# Patient Record
Sex: Female | Born: 2000 | Race: Black or African American | Hispanic: No | Marital: Single | Smoking: Never smoker
Health system: Southern US, Community
[De-identification: ages and names within clinical notes are randomized; demographics above are authoritative.]

## PROBLEM LIST (undated history)

## (undated) DIAGNOSIS — O139 Gestational [pregnancy-induced] hypertension without significant proteinuria, unspecified trimester: Secondary | ICD-10-CM

## (undated) DIAGNOSIS — I370 Nonrheumatic pulmonary valve stenosis: Secondary | ICD-10-CM

## (undated) DIAGNOSIS — D573 Sickle-cell trait: Secondary | ICD-10-CM

## (undated) DIAGNOSIS — Z3490 Encounter for supervision of normal pregnancy, unspecified, unspecified trimester: Secondary | ICD-10-CM

## (undated) DIAGNOSIS — D649 Anemia, unspecified: Secondary | ICD-10-CM

## (undated) HISTORY — PX: ADENOIDECTOMY: SUR15

## (undated) HISTORY — DX: Encounter for supervision of normal pregnancy, unspecified, unspecified trimester: Z34.90

## (undated) HISTORY — DX: Sickle-cell trait: D57.3

## (undated) HISTORY — DX: Anemia, unspecified: D64.9

## (undated) HISTORY — DX: Nonrheumatic pulmonary valve stenosis: I37.0

## (undated) HISTORY — PX: TONSILLECTOMY AND ADENOIDECTOMY: SUR1326

---
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
## 2020-01-12 ENCOUNTER — Encounter: Payer: Self-pay | Admitting: Student

## 2020-01-12 ENCOUNTER — Other Ambulatory Visit: Payer: Self-pay

## 2020-01-12 ENCOUNTER — Ambulatory Visit (INDEPENDENT_AMBULATORY_CARE_PROVIDER_SITE_OTHER): Payer: Medicaid Other | Admitting: Student

## 2020-01-12 VITALS — BP 121/69 | HR 96 | Ht 63.0 in | Wt 209.3 lb

## 2020-01-12 DIAGNOSIS — Q256 Stenosis of pulmonary artery: Secondary | ICD-10-CM

## 2020-01-12 DIAGNOSIS — Z8759 Personal history of other complications of pregnancy, childbirth and the puerperium: Secondary | ICD-10-CM

## 2020-01-12 NOTE — Patient Instructions (Addendum)
-  call [facility] for appt in Feb

## 2020-01-12 NOTE — Progress Notes (Signed)
Pt states is not interested in talking about Birth Control Today.

## 2020-01-12 NOTE — Progress Notes (Signed)
Post Partum Visit Note  Anita Hayes is a 19 y.o. G30P1001 female who presents for a postpartum visit. She is 6 weeks postpartum following a normal spontaneous vaginal delivery.  I have fully reviewed the prenatal and intrapartum course. The delivery was at 39.6 gestational weeks.  Anesthesia: epidural. Postpartum course has been without complications. Baby is doing well. Baby is feeding by bottle - Carn[employer] Good Start. Bleeding no bleeding. Bowel function is normal. Bladder function is normal. Patient is not sexually active. Contraception method is abstinence. Postpartum depression screening: negative.   The pregnancy intention screening data noted above was reviewed. Potential methods of contraception were discussed. The patient elected to proceed with Abstinence.    Edinburgh Postnatal Depression Scale - 01/12/20 1012      Edinburgh Postnatal Depression Scale:  In the Past 7 Days   I have been able to laugh and see the funny side of things. 0    I have looked forward with enjoyment to things. 0    I have blamed myself unnecessarily when things went wrong. 0    I have been anxious or worried for no good reason. 0    I have felt scared or panicky for no good reason. 0    Things have been getting on top of me. 2    I have been so unhappy that I have had difficulty sleeping. 0    I have felt sad or miserable. 0    I have been so unhappy that I have been crying. 0    The thought of harming myself has occurred to me. 0    Edinburgh Postnatal Depression Scale Total 2            The following portions of the patient's history were reviewed and updated as appropriate: allergies, current medications, past family history, past medical history, past social history, past surgical history and problem list.  Review of Systems Pertinent items are noted in HPI. Pertinent items noted in HPI and remainder of comprehensive ROS otherwise negative.    Objective:  BP 121/69   Pulse 96   Ht 5'  3" (1.6 m)   Wt 94.9 kg   LMP 02/06/2019 (Approximate)   Breastfeeding No   BMI 37.08 kg/m    General:  alert, cooperative, appears stated age and no distress   Breasts:  n/a  Lungs: clear to auscultation bilaterally  Heart:  regular rate and rhythm, S1, S2 normal, no murmur, click, rub or gallop, normal apical impulse and regular rate and rhythm  Abdomen: soft, non-tender; bowel sounds normal; no masses,  no organomegaly   Vulva:  not evaluated  Vagina: not evaluated  Cervix:  not evaluated  Corpus: not examined  Adnexa:  not evaluated  Rectal Exam: Not performed.        Assessment:    6 week postpartum exam. Pap smear not performed at today's visit.   1. Stenosis of left pulmonary artery --Patient to call to schedule follow up visit for March with [facility]  2. Postpartum care and examination --Patient educated on safe sex and contraception. Patient denies contraception at this time and states she will practice abstinence for now.   Plan:   Essential components of care per ACOG recommendations:  1.  Mood and well being: Patient with negative depression screening today. Reviewed local resources for support.  - Patient does not use tobacco. - hx of drug use? No     2. Infant care and feeding:  -  Patient currently breastmilk feeding? No  -Social determinants of health (SDOH) reviewed in EPIC. No concerns.The following needs were identified: none  3. Sexuality, contraception and birth spacing - Patient does not want a pregnancy in the next year.  Desired family size is 1 children.  - Reviewed forms of contraception in tiered fashion. Patient desired no method, abstinence today.   - Discussed birth spacing of 18 months  4. Sleep and fatigue -Encouraged family/partner/community support of 4 hrs of uninterrupted sleep to help with mood and fatigue  5. Physical Recovery  - Discussed patients delivery and complications - Patient had a 1st degree laceration,  perineal healing reviewed. Patient expressed understanding - Patient has urinary incontinence? No Patient was referred to pelvic floor PT  - Patient is safe to resume physical and sexual activity  6.  Health Maintenance -Patient not of age to have pap performed.   7. Chronic Disease - Follow up with [place] [facility] for appt in March  Sande Rives, [facility] for [employer], [employer] [facility]   Attestation of Supervision of Student:  I confirm that I have verified the information documented in the nurse midwife student's note and that I have also personally reperformed the history, physical exam and all medical decision making activities.  I have verified that all services and findings are accurately documented in this student's note; and I agree with management and plan as outlined in the documentation. I have also made any necessary editorial changes.    Marylene Land, CNM Center for [employer], [facility] 01/12/2020 11:57 AM

## 2020-01-12 NOTE — Progress Notes (Signed)
Post Partum Visit Note  Anita Hayes is a 19 y.o. G59P1001 female who presents for a postpartum visit. She is 6 weeks postpartum following a normal spontaneous vaginal delivery.  I have fully reviewed the prenatal and intrapartum course. The delivery was at 39/6 gestational weeks.  Anesthesia: epidural. Postpartum course has been uneventful. Baby is doing well. Baby is feeding by bottle - [employer] Good Start. Bleeding no bleeding. Bowel function is normal. Bladder function is normal. Patient is not sexually active. Contraception method is none. Postpartum depression screening: negative.   The pregnancy intention screening data noted above was reviewed. Potential methods of contraception were discussed. The patient elected to proceed with No Method - Other Reason.    Edinburgh Postnatal Depression Scale - 01/12/20 1012      Edinburgh Postnatal Depression Scale:  In the Past 7 Days   I have been able to laugh and see the funny side of things. 0    I have looked forward with enjoyment to things. 0    I have blamed myself unnecessarily when things went wrong. 0    I have been anxious or worried for no good reason. 0    I have felt scared or panicky for no good reason. 0    Things have been getting on top of me. 2    I have been so unhappy that I have had difficulty sleeping. 0    I have felt sad or miserable. 0    I have been so unhappy that I have been crying. 0    The thought of harming myself has occurred to me. 0    Edinburgh Postnatal Depression Scale Total 2            The following portions of the patient's history were reviewed and updated as appropriate: allergies, current medications, past family history, past medical history, past social history, past surgical history and problem list.  Review of Systems Pertinent items are noted in HPI.    Objective:  BP 121/69   Pulse 96   Ht 5\' 3"  (1.6 m)   Wt 209 lb 4.8 oz (94.9 kg)   LMP 02/06/2019 (Approximate)    Breastfeeding No   BMI 37.08 kg/m    General:  alert and cooperative   Breasts:  negative  Lungs: clear to auscultation bilaterally  Heart:  regular rate and rhythm, S1, S2 normal, no murmur, click, rub or gallop  Abdomen: soft, non-tender; bowel sounds normal; no masses,  no organomegaly   Vulva:  not evaluated  Vagina: not evaluated  Cervix:  not examined.   Corpus: not examined  Adnexa:  not evaluated  Rectal Exam: Not performed.        Assessment:    Healthy postpartum exam. Pap smear not done at today's visit.   Plan:   Essential components of care per ACOG recommendations:  1.  Mood and well being: Patient with negative depression screening today. Reviewed local resources for support.  - Patient does not use tobacco.  - hx of drug use? No   2. Infant care and feeding:  -Patient currently breastmilk feeding? No  -Social determinants of health (SDOH) reviewed in EPIC. No concerns  3. Sexuality, contraception and birth spacing - Patient does not want a pregnancy in the next year.  Desired family size is unknown children.  - Reviewed forms of contraception in tiered fashion. Patient desired not to talk about birth control today. States "I'm not doing that anymore".  today.   -  Discussed birth spacing of 18 months  4. Sleep and fatigue -Encouraged family/partner/community support of 4 hrs of uninterrupted sleep to help with mood and fatigue  5. Physical Recovery  - Discussed patients delivery and complications - Patient had a 1st degree laceration, perineal healing reviewed. Patient expressed understanding - Patient has urinary incontinence? No  - Patient is safe to resume physical and sexual activity  6.  Health Maintenance  7. Chronic Disease - PCP follow up -Will follow up with  [facility] for cardiac care in Feb. Patient has number and is planning to call.   Marylene Land, CNM Center for [employer], [facility]

## 2020-02-15 DIAGNOSIS — Z03818 Encounter for observation for suspected exposure to other biological agents ruled out: Secondary | ICD-10-CM | POA: Diagnosis not present

## 2020-03-17 ENCOUNTER — Encounter: Payer: Self-pay | Admitting: Family Medicine

## 2020-04-12 ENCOUNTER — Encounter: Payer: Self-pay | Admitting: Internal Medicine

## 2020-04-12 ENCOUNTER — Other Ambulatory Visit: Payer: Self-pay

## 2020-04-12 ENCOUNTER — Ambulatory Visit (INDEPENDENT_AMBULATORY_CARE_PROVIDER_SITE_OTHER): Payer: Medicaid Other | Admitting: Internal Medicine

## 2020-04-12 DIAGNOSIS — O99210 Obesity complicating pregnancy, unspecified trimester: Secondary | ICD-10-CM | POA: Insufficient documentation

## 2020-04-12 DIAGNOSIS — Q256 Stenosis of pulmonary artery: Secondary | ICD-10-CM

## 2020-04-12 HISTORY — DX: Morbid (severe) obesity due to excess calories: E66.01

## 2020-04-12 NOTE — Patient Instructions (Signed)
Medication Instructions:  Your physician recommends that you continue on your current medications as directed. Please refer to the Current Medication list given to you today.  *If you need a refill on your cardiac medications before your next appointment, please call your pharmacy*   Lab Work: NONE If you have labs (blood work) drawn today and your tests are completely normal, you will receive your results only by: Marland Kitchen MyChart Message (if you have MyChart) OR . A paper copy in the mail If you have any lab test that is abnormal or we need to change your treatment, we will call you to review the results.   Testing/Procedures: Your physician has requested that you have an echocardiogram in 23 months. Echocardiography is a painless test that uses sound waves to create images of your heart. It provides your doctor with information about the size and shape of your heart and how well your heart's chambers and valves are working. This procedure takes approximately one hour. There are no restrictions for this procedure.     Follow-Up: At [facility], you and your health needs are our priority.  As part of our continuing mission to provide you with exceptional heart care, we have created designated Provider Care Teams.  These Care Teams include your primary Cardiologist (physician) and Advanced Practice Providers (APPs -  Physician Assistants and Nurse Practitioners) who all work together to provide you with the care you need, when you need it.  We recommend signing up for the patient portal called "MyChart".  Sign up information is provided on this After Visit Summary.  MyChart is used to connect with patients for Virtual Visits (Telemedicine).  Patients are able to view lab/test results, encounter notes, upcoming appointments, etc.  Non-urgent messages can be sent to your provider as well.   To learn more about what you can do with MyChart, go to [email or web address].    Your next appointment:    24 month(s)  The format for your next appointment:   In Person  Provider:   You may see Izora Ribas, MD or one of the following Advanced Practice Providers on your designated Care Team:    Ronie Spies, PA-C  Jacolyn Reedy, PA-C

## 2020-04-12 NOTE — Progress Notes (Signed)
Cardiology Office Note:    Date:  04/12/2020   ID:  Anita Hayes, DOB 04-Apr-2000, MRN [id number]  Referring MD: Matilde Haymaker, MD   FM:[id number] left pulmonary arterial stenosis; Follow up  History of Present Illness:    Anita Hayes is a 20 y.o. 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 In interim of this visit, patient has no HD significant increase in gradient.  Had uncomplicated delivery.  Patient notes that she is doing well.  Since last visit notes no issues with delivery.   No chest pain or pressure.  No SOB/DOE.  No leg swelling.  No palpitations or syncope.  Past Medical History:  Diagnosis Date  . Acne 07/16/2016  . Pregnancy   . Pulmonary stenosis   . Sickle cell trait ([place])   . 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        Past Surgical History:  Procedure Laterality Date  . ADENOIDECTOMY    . TONSILLECTOMY AND ADENOIDECTOMY      Current Medications: Current Meds  Medication Sig  . acetaminophen (TYLENOL) 325 MG tablet Take 2 tablets (650 mg total) by mouth every 4 (four) hours as needed (for pain scale < 4).  Marland Kitchen amLODipine (NORVASC) 5 MG tablet Take 1 tablet (5 mg total) by mouth daily.  . Blood Pressure Monitoring (BLOOD PRESSURE KIT) DEVI 1 Device by Does not apply route as needed.  . ferrous sulfate 325 (65 FE) MG EC tablet Take 1 tablet (325 mg total) by mouth 3 (three) times daily with meals.  Marland Kitchen ibuprofen (ADVIL) 600 MG tablet Take 1 tablet (600 mg total) by mouth every 6 (six) hours.    Allergies:   Patient has no known allergies.   Social History   Socioeconomic History  . Marital status: Single    Spouse name: Not on file  . Number of children: Not on file  . Years of education: Not on  file  . Highest education level: Not on file  Occupational History  . Not on file  Tobacco Use  . Smoking status: Never Smoker  . Smokeless tobacco: Never Used  Substance and Sexual Activity  . Alcohol use: Never  . Drug use: Never  . Sexual activity: Not on file  Other Topics Concern  . Not on file  Social History Narrative   ** Merged History Encounter **       Social Determinants of Health   Financial Resource Strain: Not on file  Food Insecurity: No Food Insecurity  . Worried About Charity fundraiser in the Last Year: Never true  . Ran Out of Food in the Last Year: Never true  Transportation Needs: No Transportation Needs  . Lack of Transportation (Medical): No  . Lack of Transportation (Non-Medical): No  Physical Activity: Not on file  Stress: Not on file  Social Connections: Not on file    Social: Baby's Name is Phillips Odor  Family History: The patient's family history is notable for no congenital heart disease.    ROS:   Please see the history of present illness.    All other systems reviewed and are negative.  EKGs/Labs/Other Studies Reviewed:    The following studies were reviewed today:  EKG:   10/31/19: sinus rhythm, rate 99, without ST T chagnes.  Transthoracic Echocardiogram: Date: 11/16/19 Results: 1. Left ventricular ejection fraction, by estimation, is 60 to 65%. The  left ventricle has normal function. The left ventricle has no regional  wall motion abnormalities. Left ventricular diastolic parameters were  normal.  2. Right ventricular systolic function is normal. The right ventricular  size is normal.  3. The mitral valve is normal in structure. No evidence of mitral valve  regurgitation. No evidence of mitral stenosis.  4. The aortic valve is normal in structure. Aortic valve regurgitation is  not visualized.  5. The pulmonic valve, pulmonary truck and the proximal right and left  pulmonary arteries appear normal . I do not see any  evidence of pulmonary  artery stenosis.   Pediatric Echocardiogram: [id number] Results: 2016 Peds Echo Report INTERPRETATION SUMMARY  Trivial left pulmonary artery stenosis, peak gradient = 16 mmHg.  CARDIAC POSITION  Levocardia. Abdominal situs solitus. Atrial situs solitus. D Ventricular Loop. S Normal  position great vessels.  VEINS  Normal systemic venous connections. The right and left lower pulmonary veins are  confirmed draining normally to the left atrium. Normal pulmonary vein velocity.  ATRIA  Normal right atrial size. Normal left atrial size. The atrial septum is not well seen.  ATRIOVENTRICULAR VALVES  Normal tricuspid valve. Normal tricuspid valve inflow velocity. Trace tricuspid valve  regurgitation. Inadequate amount of tricuspid valve insufficiency to estimate right  ventricular pressures. Normal mitral valve. Normal mitral valve inflow velocity. No  mitral valve regurgitation.  VENTRICLES  Normal right ventricle structure and size. Normal left ventricle structure and size.  Intact ventricular septum.  CARDIAC FUNCTION  Normal right ventricular systolic function. Normal left ventricular systolic function.  SEMILUNAR VALVES  Normal pulmonic valve. Normal pulmonic valve velocity. Trivial pulmonary valve  insufficiency. Tricommissural aortic valve. Aortic valve mobility appears normal. Normal  aortic valve velocity by Doppler. No aortic valve insufficiency by color Doppler.  CORONARY ARTERIES  Normal origin and proximal course of the right coronary artery with prograde flow  demonstrated by color Doppler. Normal origin and proximal course of the left coronary  artery with prograde flow demonstrated by color Doppler.  GREAT ARTERIES  Left aortic arch with normal branching pattern. No evidence of coarctation of the aorta.  No aortic root dilation. There is normal pulsatility of the abdominal aorta. Normal main  pulmonary  artery size. Trivial left pulmonary artery stenosis. The peak instantaneous  pressure gradient in the left pulmonary artery is 16.  SHUNTS  No patent ductus arteriosus.  EXTRACARDIAC  No pericardial effusion. There is no pleural effusion.  2016 Peds Echo Report: +-----------------------------------------------------------------------------------------+  INTERPRETATION SUMMARY  Trivial left pulmonary artery stenosis, peak gradient = 16 mmHg.    CARDIAC POSITION  Levocardia. Abdominal situs solitus. Atrial situs solitus. D Ventricular Loop. S Normal  position great vessels.    VEINS  Normal systemic venous connections. The right and left lower pulmonary veins are  confirmed draining normally to the left atrium. Normal pulmonary vein velocity.    ATRIA  Normal right atrial size. Normal left atrial size. The atrial septum is not well seen.    ATRIOVENTRICULAR VALVES  Normal tricuspid valve. Normal tricuspid valve inflow velocity. Trace tricuspid valve  regurgitation. Inadequate amount of tricuspid valve insufficiency to estimate right  ventricular pressures. Normal mitral valve. Normal mitral valve inflow velocity. No  mitral valve regurgitation.    VENTRICLES  Normal right ventricle structure and size. Normal left ventricle structure and size.  Intact ventricular septum.    CARDIAC FUNCTION  Normal right ventricular systolic function. Normal left ventricular systolic function.    SEMILUNAR VALVES  Normal pulmonic valve. Normal pulmonic valve velocity. Trivial pulmonary valve  insufficiency. Tricommissural aortic valve. Aortic valve mobility appears normal. Normal  aortic valve velocity by Doppler. No aortic valve insufficiency by color Doppler.    CORONARY ARTERIES  Normal origin and proximal course of the right coronary artery with prograde flow  demonstrated by color Doppler. Normal origin and  proximal course of the left coronary  artery with prograde flow demonstrated by color Doppler.    GREAT ARTERIES  Left aortic arch with normal branching pattern. No evidence of coarctation of the aorta.  No aortic root dilation. There is normal pulsatility of the abdominal aorta. Normal main  pulmonary artery size. Trivial left pulmonary artery stenosis. The peak instantaneous  pressure gradient in the left pulmonary artery is 16.    SHUNTS  No patent ductus arteriosus.    EXTRACARDIAC  No pericardial effusion. There is no pleural effusion.     Physical Exam:    VS:  BP 122/68   Pulse 71   Ht 5' 3" (1.6 m)   Wt 224 lb (101.6 kg)   SpO2 98%   BMI 39.68 kg/m     Wt Readings from Last 3 Encounters:  04/12/20 224 lb (101.6 kg)  01/12/20 209 lb 4.8 oz (94.9 kg) (98 %, Z= 2.07)*  12/08/19 209 lb 3.2 oz (94.9 kg) (98 %, Z= 2.07)*   * Growth percentiles are based on CDC (Girls, 2-20 Years) data.    GEN: Well nourished, well developed in no acute distress HEENT: Normal NECK: No JVD; No carotid bruits LYMPHATICS: No lymphadenopathy CARDIAC: III/Vi systolic crescendo without thrill no rubs, gallops RESPIRATORY:  Clear to auscultation without rales, wheezing or rhonchi  ABDOMEN: Soft, non-tender, non-distended MUSCULOSKELETAL:  Trivial non-pitting edema; No deformity  SKIN: Warm and dry NEUROLOGIC:  Alert and oriented x 3 PSYCHIATRIC:  Normal affect   ASSESSMENT:    1. Morbid obesity ([place])   2. Pulmonary artery stenosis    PLAN:    In order of problems listed above:  Isolated Pulmonary Arterial Stenosis Morbid Obesity - left, mild and  asymptomatic - recommendation is for conservative follow up:  Will plan for 3 year echo and will see in 2 years; discussed red flag symptoms with patient and family - [facility] referral offered; patient deferred at this time, but will consider [facility] referral if symptomatic  Two year follow up unless new  symptoms or abnormal test results warranting change in plan  Would be reasonable for  Video Visit Follow up   Medication Adjustments/Labs and Tests Ordered: Current medicines are reviewed at length with the patient today.  Concerns regarding medicines are outlined above.  No orders of the defined types were placed in this encounter.  No orders of the defined types were placed in this encounter.   Patient Instructions  Medication Instructions:  Your physician recommends that you continue on your current medications as directed. Please refer to the Current Medication list given to you today.  *If you need a refill on your cardiac medications before your next appointment, please call your pharmacy*   Lab Work: NONE If you have labs (blood work) drawn today and your tests are completely normal, you will receive your results only by: Marland Kitchen MyChart Message (if you have MyChart) OR . A paper copy in the mail If you have any lab test that is abnormal or we need to  change your treatment, we will call you to review the results.   Testing/Procedures: Your physician has requested that you have an echocardiogram in 23 months. Echocardiography is a painless test that uses sound waves to create images of your heart. It provides your doctor with information about the size and shape of your heart and how well your heart's chambers and valves are working. This procedure takes approximately one hour. There are no restrictions for this procedure.     Follow-Up: At [facility], you and your health needs are our priority.  As part of our continuing mission to provide you with exceptional heart care, we have created designated Provider Care Teams.  These Care Teams include your primary Cardiologist (physician) and Advanced Practice Providers (APPs -  Physician Assistants and Nurse Practitioners) who all work together to provide you with the care you need, when you need it.  We recommend signing up for  the patient portal called "MyChart".  Sign up information is provided on this After Visit Summary.  MyChart is used to connect with patients for Virtual Visits (Telemedicine).  Patients are able to view lab/test results, encounter notes, upcoming appointments, etc.  Non-urgent messages can be sent to your provider as well.   To learn more about what you can do with MyChart, go to [email or web address].    Your next appointment:   24 month(s)  The format for your next appointment:   In Person  Provider:   You may see Gasper Sells, MD or one of the following Advanced Practice Providers on your designated Care Team:    Melina Copa, PA-C  Ermalinda Barrios, PA-C          Signed, Werner Lean, MD  04/12/2020 9:48 AM    [place]

## 2020-08-30 ENCOUNTER — Encounter: Payer: Self-pay | Admitting: Family Medicine

## 2020-09-03 NOTE — Telephone Encounter (Signed)
Scheduled patient for clinic follow up on 8/19. Patient attached pictures, one looking negative and one faintly positive. LMP 08/23/20.   FYI  Veronda Prude, RN

## 2020-09-21 ENCOUNTER — Ambulatory Visit: Payer: Medicaid Other | Admitting: Family Medicine

## 2020-11-01 ENCOUNTER — Encounter: Payer: Self-pay | Admitting: Family Medicine

## 2020-11-08 NOTE — Progress Notes (Signed)
    SUBJECTIVE:   CHIEF COMPLAINT / HPI: Pregnancy test  Missed last menstrual period and wants pregnancy test. LMP started 8/11 and ended 8/16.  She is not taking any regular medications.  She is unsure if she wants to keep the pregnancy and was asking about Plan B.  PERTINENT  PMH / PSH: Morbid obesity, sickle cell trait, pulmonary artery stenosis  OBJECTIVE:   BP 121/70   Pulse 85   Ht 5\' 3"  (1.6 m)   Wt 231 lb (104.8 kg)   SpO2 100%   BMI 40.92 kg/m   General: Alert, NAD CV: Regular rate Pulm: No respiratory distress  ASSESSMENT/PLAN:   Positive pregnancy test Positive urine pregnancy test in office. Approximately [redacted]w[redacted]d dated by LMP.  Prenatal labs obtained today (obstetric panel, OB urine culture, UA, HCV).  Resources given in case she wants to have an abortion, she will discuss with her partner.  Message sent to admin to schedule initial prenatal visit.     [redacted]w[redacted]d, MD [facility]

## 2020-11-08 NOTE — Patient Instructions (Addendum)
It was nice seeing you today!  Our scheduler will reach out to you regarding your first prenatal visit.  If there is anything abnormal on your blood work, I will give you a call.  Go ahead and start taking a prenatal vitamin.  If interested in abortion, there are two places in [place] you can go:  [facility] [address] [place], [place] [address] [phone]  [facility] [address], [place], [place] [address] [phone]  Please arrive at least 15 minutes prior to your scheduled appointments.  Stay well, Littie Deeds, MD [facility] [phone]

## 2020-11-09 ENCOUNTER — Ambulatory Visit (INDEPENDENT_AMBULATORY_CARE_PROVIDER_SITE_OTHER): Payer: Medicaid Other | Admitting: Family Medicine

## 2020-11-09 ENCOUNTER — Other Ambulatory Visit: Payer: Self-pay

## 2020-11-09 VITALS — BP 121/70 | HR 85 | Ht 63.0 in | Wt 231.0 lb

## 2020-11-09 DIAGNOSIS — N926 Irregular menstruation, unspecified: Secondary | ICD-10-CM

## 2020-11-09 DIAGNOSIS — Z3201 Encounter for pregnancy test, result positive: Secondary | ICD-10-CM | POA: Diagnosis not present

## 2020-11-09 LAB — POCT URINALYSIS DIP (CLINITEK)
Bilirubin, UA: NEGATIVE
Blood, UA: NEGATIVE
Glucose, UA: NEGATIVE mg/dL
Ketones, POC UA: NEGATIVE mg/dL
Nitrite, UA: NEGATIVE
POC PROTEIN,UA: NEGATIVE
Spec Grav, UA: 1.02 (ref 1.010–1.025)
Urobilinogen, UA: 0.2 E.U./dL
pH, UA: 6 (ref 5.0–8.0)

## 2020-11-09 LAB — POCT URINE PREGNANCY: Preg Test, Ur: POSITIVE — AB

## 2020-11-09 LAB — POCT UA - MICROSCOPIC ONLY: Epithelial cells, urine per micros: >20

## 2020-11-09 NOTE — Assessment & Plan Note (Signed)
Positive urine pregnancy test in office. Approximately [redacted]w[redacted]d dated by LMP.  Prenatal labs obtained today (obstetric panel, OB urine culture, UA, HCV).  Resources given in case she wants to have an abortion, she will discuss with her partner.  Message sent to admin to schedule initial prenatal visit.

## 2020-11-10 ENCOUNTER — Encounter: Payer: Self-pay | Admitting: Family Medicine

## 2020-11-10 LAB — OBSTETRIC PANEL, INCLUDING HIV
Antibody Screen: NEGATIVE
Basophils Absolute: 0 10*3/uL (ref 0.0–0.2)
Basos: 0 %
EOS (ABSOLUTE): 0.1 10*3/uL (ref 0.0–0.4)
Eos: 1 %
HIV Screen 4th Generation wRfx: NONREACTIVE
Hematocrit: 33.5 % — ABNORMAL LOW (ref 34.0–46.6)
Hemoglobin: 9.9 g/dL — ABNORMAL LOW (ref 11.1–15.9)
Hepatitis B Surface Ag: NEGATIVE
Immature Grans (Abs): 0 10*3/uL (ref 0.0–0.1)
Immature Granulocytes: 0 %
Lymphocytes Absolute: 2.4 10*3/uL (ref 0.7–3.1)
Lymphs: 31 %
MCH: 19 pg — ABNORMAL LOW (ref 26.6–33.0)
MCHC: 29.6 g/dL — ABNORMAL LOW (ref 31.5–35.7)
MCV: 64 fL — ABNORMAL LOW (ref 79–97)
Monocytes Absolute: 0.4 10*3/uL (ref 0.1–0.9)
Monocytes: 6 %
Neutrophils Absolute: 4.8 10*3/uL (ref 1.4–7.0)
Neutrophils: 62 %
Platelets: 243 10*3/uL (ref 150–450)
RBC: 5.22 x10E6/uL (ref 3.77–5.28)
RDW: 22.3 % — ABNORMAL HIGH (ref 11.7–15.4)
RPR Ser Ql: NONREACTIVE
Rh Factor: POSITIVE
Rubella Antibodies, IGG: <0.9 index — ABNORMAL LOW (ref >0.99)
WBC: 7.7 10*3/uL (ref 3.4–10.8)

## 2020-11-10 LAB — HCV AB W REFLEX TO QUANT PCR: HCV Ab: <0.1 s/co ratio (ref 0.0–0.9)

## 2020-11-10 LAB — HCV INTERPRETATION

## 2020-11-13 ENCOUNTER — Telehealth: Payer: Self-pay | Admitting: Family Medicine

## 2020-11-13 MED ORDER — FERROUS SULFATE 325 (65 FE) MG PO TBEC: 325.0000 mg | DELAYED_RELEASE_TABLET | ORAL | 2 refills | Status: DC

## 2020-11-13 NOTE — Telephone Encounter (Signed)
   Anita Hayes DOB: [birth year] MRN: [id number]   RIDER WAIVER AND RELEASE OF LIABILITY  For purposes of improving physical access to our facilities, [facility] is pleased to partner with third parties to provide [place] patients or other authorized individuals the option of convenient, on-demand ground transportation services (the [employer]") through use of the technology service that enables users to request on-demand ground transportation from independent third-party providers.  By opting to use and accept these [employer], I, the undersigned, hereby agree on behalf of myself, and on behalf of any minor child using the Science writer for whom I am the parent or legal guardian, as follows:  Science writer provided to me are provided by independent third-party transportation providers who are not [employer] or employees and who are unaffiliated with [employer]. [place] is neither a transportation carrier nor a common or public carrier. [place] [facility] has no control over the quality or safety of the transportation that occurs as a result of the [employer]. [place] cannot guarantee that any third-party transportation provider will complete any arranged transportation service. [facility] makes no representation, warranty, or guarantee regarding the reliability, timeliness, quality, safety, suitability, or availability of any of the Transport Services or that they will be error free. I fully understand that traveling by vehicle involves risks and dangers of serious bodily injury, including permanent disability, paralysis, and death. I agree, on behalf of myself and on behalf of any minor child using the Transport Services for whom I am the parent or legal guardian, that the entire risk arising out of my use of the [employer] remains solely with me, to the maximum extent permitted under applicable law. The [employer] are provided "as  is" and "as available." [place] disclaims all representations and warranties, express, implied or statutory, not expressly set out in these terms, including the implied warranties of merchantability and fitness for a particular purpose. I hereby waive and release [place], its agents, employees, officers, directors, representatives, insurers, attorneys, assigns, successors, subsidiaries, and affiliates from any and all past, present, or future claims, demands, liabilities, actions, causes of action, or suits of any kind directly or indirectly arising from acceptance and use of the [employer]. I further waive and release [place] and its affiliates from all present and future liability and responsibility for any injury or death to persons or damages to property caused by or related to the use of the [employer]. I have read this Waiver and Release of Liability, and I understand the terms used in it and their legal significance. This Waiver is freely and voluntarily given with the understanding that my right (as well as the right of any minor child for whom I am the parent or legal guardian using the [employer]) to legal recourse against [place] in connection with the [employer] is knowingly surrendered in return for use of these services.   I attest that I read the consent document to [name], gave [name] the opportunity to ask questions and answered the questions asked (if any). I affirm that [name] then provided consent for she's participation in this program.     [name]

## 2020-11-13 NOTE — Telephone Encounter (Signed)
Called patient regarding lab results with no answer. MyChart message sent, order placed for iron supplement given slight anemia.

## 2020-11-15 LAB — URINE CULTURE, OB REFLEX

## 2020-11-15 LAB — CULTURE, OB URINE

## 2020-11-17 ENCOUNTER — Encounter: Payer: Self-pay | Admitting: Family Medicine

## 2020-11-21 ENCOUNTER — Other Ambulatory Visit (HOSPITAL_COMMUNITY)
Admission: RE | Admit: 2020-11-21 | Discharge: 2020-11-21 | Disposition: A | Discharge status: Home / Self Care | Payer: Medicaid Other | Source: Ambulatory Visit | Attending: Family Medicine | Admitting: Family Medicine

## 2020-11-21 ENCOUNTER — Telehealth: Payer: Self-pay

## 2020-11-21 ENCOUNTER — Ambulatory Visit (INDEPENDENT_AMBULATORY_CARE_PROVIDER_SITE_OTHER): Payer: Medicaid Other | Admitting: Family Medicine

## 2020-11-21 ENCOUNTER — Other Ambulatory Visit: Payer: Self-pay

## 2020-11-21 ENCOUNTER — Other Ambulatory Visit: Payer: Self-pay | Admitting: Family Medicine

## 2020-11-21 ENCOUNTER — Encounter: Payer: Self-pay | Admitting: Family Medicine

## 2020-11-21 VITALS — BP 119/62 | HR 84 | Wt 227.0 lb

## 2020-11-21 DIAGNOSIS — Z3481 Encounter for supervision of other normal pregnancy, first trimester: Secondary | ICD-10-CM

## 2020-11-21 DIAGNOSIS — O99011 Anemia complicating pregnancy, first trimester: Secondary | ICD-10-CM | POA: Diagnosis present

## 2020-11-21 DIAGNOSIS — A749 Chlamydial infection, unspecified: Secondary | ICD-10-CM

## 2020-11-21 DIAGNOSIS — O98311 Other infections with a predominantly sexual mode of transmission complicating pregnancy, first trimester: Secondary | ICD-10-CM | POA: Diagnosis not present

## 2020-11-21 DIAGNOSIS — D573 Sickle-cell trait: Secondary | ICD-10-CM | POA: Diagnosis not present

## 2020-11-21 DIAGNOSIS — Z3A10 10 weeks gestation of pregnancy: Secondary | ICD-10-CM

## 2020-11-21 HISTORY — DX: Chlamydial infection, unspecified: A74.9

## 2020-11-21 MED ORDER — ASPIRIN EC 81 MG PO TBEC: 81.0000 mg | DELAYED_RELEASE_TABLET | Freq: Every day | ORAL | 3 refills | Status: DC

## 2020-11-21 NOTE — Patient Instructions (Signed)
First Trimester of Pregnancy °The first trimester of pregnancy starts on the first day of your last menstrual period until the end of week 12. This is months 1 through 3 of pregnancy. A week after a sperm fertilizes an egg, the egg will implant into the wall of the uterus and begin to develop into a baby. By the end of 12 weeks, all the baby's organs will be formed and the baby will be 2-3 inches in size. °Body changes during your first trimester °Your body goes through many changes during pregnancy. The changes vary and generally return to normal after your baby is born. °Physical changes °You may gain or lose weight. °Your breasts may begin to grow larger and become tender. The tissue that surrounds your nipples (areola) may become darker. °Dark spots or blotches (chloasma or mask of pregnancy) may develop on your face. °You may have changes in your hair. These can include thickening or thinning of your hair or changes in texture. °Health changes °You may feel nauseous, and you may vomit. °You may have heartburn. °You may develop headaches. °You may develop constipation. °Your gums may bleed and may be sensitive to brushing and flossing. °Other changes °You may tire easily. °You may urinate more often. °Your menstrual periods will stop. °You may have a loss of appetite. °You may develop cravings for certain kinds of food. °You may have changes in your emotions from day to day. °You may have more vivid and strange dreams. °Follow these instructions at home: °Medicines °Follow your health care provider's instructions regarding medicine use. Specific medicines may be either safe or unsafe to take during pregnancy. Do not take any medicines unless told to by your health care provider. °Take a prenatal vitamin that contains at least 600 micrograms (mcg) of folic acid. °Eating and drinking °Eat a healthy diet that includes fresh fruits and vegetables, whole grains, good sources of protein such as meat, eggs, or tofu,  and low-fat dairy products. °Avoid raw meat and unpasteurized juice, milk, and cheese. These carry germs that can harm you and your baby. °If you feel nauseous or you vomit: °Eat 4 or 5 small meals a day instead of 3 large meals. °Try eating a few soda crackers. °Drink liquids between meals instead of during meals. °You may need to take these actions to prevent or treat constipation: °Drink enough fluid to keep your urine pale yellow. °Eat foods that are high in fiber, such as beans, whole grains, and fresh fruits and vegetables. °Limit foods that are high in fat and processed sugars, such as fried or sweet foods. °Activity °Exercise only as directed by your health care provider. Most people can continue their usual exercise routine during pregnancy. Try to exercise for 30 minutes at least 5 days a week. °Stop exercising if you develop pain or cramping in the lower abdomen or lower back. °Avoid exercising if it is very hot or humid or if you are at high altitude. °Avoid heavy lifting. °If you choose to, you may have sex unless your health care provider tells you not to. °Relieving pain and discomfort °Wear a good support bra to relieve breast tenderness. °Rest with your legs elevated if you have leg cramps or low back pain. °If you develop bulging veins (varicose veins) in your legs: °Wear support hose as told by your health care provider. °Elevate your feet for 15 minutes, 3-4 times a day. °Limit salt in your diet. °Safety °Wear your seat belt at all times when driving   or riding in a car. °Talk with your health care provider if someone is verbally or physically abusive to you. °Talk with your health care provider if you are feeling sad or have thoughts of hurting yourself. °Lifestyle °Do not use hot tubs, steam rooms, or saunas. °Do not douche. Do not use tampons or scented sanitary pads. °Do not use herbal remedies, alcohol, illegal drugs, or medicines that are not approved by your health care provider. Chemicals  in these products can harm your baby. °Do not use any products that contain nicotine or tobacco, such as cigarettes, e-cigarettes, and chewing tobacco. If you need help quitting, ask your health care provider. °Avoid cat litter boxes and soil used by cats. These carry germs that can cause birth defects in the baby and possibly loss of the unborn baby (fetus) by miscarriage or stillbirth. °General instructions °During routine prenatal visits in the first trimester, your health care provider will do a physical exam, perform necessary tests, and ask you how things are going. Keep all follow-up visits. This is important. °Ask for help if you have counseling or nutritional needs during pregnancy. Your health care provider can offer advice or refer you to specialists for help with various needs. °Schedule a dentist appointment. At home, brush your teeth with a soft toothbrush. Floss gently. °Write down your questions. Take them to your prenatal visits. °Where to find more information °American Pregnancy Association: americanpregnancy.org °American College of Obstetricians and Gynecologists: acog.org/en/Womens%20Health/Pregnancy °Office on Women's Health: womenshealth.gov/pregnancy °Contact a health care provider if you have: °Dizziness. °A fever. °Mild pelvic cramps, pelvic pressure, or nagging pain in the abdominal area. °Nausea, vomiting, or diarrhea that lasts for 24 hours or longer. °A bad-smelling vaginal discharge. °Pain when you urinate. °Known exposure to a contagious illness, such as chickenpox, measles, Zika virus, HIV, or hepatitis. °Get help right away if you have: °Spotting or bleeding from your vagina. °Severe abdominal cramping or pain. °Shortness of breath or chest pain. °Any kind of trauma, such as from a fall or a car crash. °New or increased pain, swelling, or redness in an arm or leg. °Summary °The first trimester of pregnancy starts on the first day of your last menstrual period until the end of week  12 (months 1 through 3). °Eating 4 or 5 small meals a day rather than 3 large meals may help to relieve nausea and vomiting. °Do not use any products that contain nicotine or tobacco, such as cigarettes, e-cigarettes, and chewing tobacco. If you need help quitting, ask your health care provider. °Keep all follow-up visits. This is important. °This information is not intended to replace advice given to you by your health care provider. Make sure you discuss any questions you have with your health care provider. °Document Revised: 06/29/2019 Document Reviewed: 05/05/2019 °Elsevier Patient Education © 2022 [employer]. ° °

## 2020-11-21 NOTE — Telephone Encounter (Signed)
Spoke with patient informed of ultra sound at [facility] on 04-Dec-2020 at 2:45. Patient understood. Aquilla Solian, CMA

## 2020-11-21 NOTE — Progress Notes (Signed)
Patient Name: Anita Hayes Date of Birth: [birth year] [facility] Initial Prenatal Visit  Anita Hayes is a 20 y.o. year old G2P1001 at Unknown who presents for her initial prenatal visit. Pregnancy is not planned She reports fatigue and nausea. She is taking a prenatal vitamin.  She denies pelvic pain or vaginal bleeding.   Pregnancy Dating: The patient is dated by LMP.  LMP: 8/11-8/16 Period is certain:  No.  Periods were regular:  Yes.  LMP was a typical period:  Yes.  Using hormonal contraception in 3 months prior to conception: No  Lab Review: Blood type: A Rh Status: + Antibody screen: Negative HIV: Negative RPR: Negative Results of OB urine culture are: Negative Rubella: Equivocal, non-immune Hep C Ab: Negative Varicella status is Immune  PMH: Reviewed and as detailed below: HTN: No Gestational Hypertension/preeclampsia: Yes, history of gHTN Type 1 or 2 Diabetes: No  Depression:  No  Seizure disorder:  No VTE: No ,  History of STI Yes, GC/Ch Abnormal Pap smear:  No, has not had a pap smear due to age Genital herpes simplex:  No  Patient has sickle cell trait  PSH: Gynecologic Surgery:  no Surgical history reviewed, notable for: Tonsillectomy and adenoidectomy  Obstetric History: Obstetric history tab updated and reviewed.  Summary of prior pregnancies: G2 P1-0-0-1, born at 39 weeks 6 days SVD Cesarean delivery: No  Gestational Diabetes:  No Hypertension in pregnancy: Yes, previously treated with Norvasc History of preterm birth: No History of LGA/SGA infant:  No History of shoulder dystocia: No Indications for referral were reviewed, and the patient has no obstetric indications for referral to High Risk OB Clinic at this time.   Social History: Partner's name: Patient does not wish to state Tobacco use: No Alcohol use:  No Other substance use:  No  Current Medications:  Prenatal vitamin Reviewed and appropriate in pregnancy.    Genetic and Infection Screen: Flow Sheet Updated Yes  Prenatal Exam: Gen: Well nourished, well developed.  No distress.  Vitals noted. HEENT: Normocephalic, atraumatic.  Neck supple without cervical lymphadenopathy, thyromegaly or thyroid nodules.  Fair dentition. CV: RRR no murmur, gallops or rubs Lungs: CTA B.  Normal respiratory effort without wheezes or rales. Abd: soft, NTND. +BS.  Uterus not appreciated above pelvis. GU: Normal external female genitalia without lesions.  Nl vaginal, well rugated without lesions.  Slight thin Paz vaginal discharge.  Bimanual exam: No adnexal mass or TTP. No CMT.  Ext: No clubbing, cyanosis or edema. Psych: Normal grooming and dress.  Not depressed or anxious appearing.  Normal thought content and process without flight of ideas or looseness of associations. Somewhat flat affect  Fetal heart tones:  Not examined as patient is not yet 10 weeks and with a BMI of 40.21, will check at next visit*  Assessment/Plan:  Anita Hayes is a 20 y.o. G2P1001 at Unknown who presents to initiate prenatal care. She is doing well.  Current pregnancy issues include anemia.  Routine prenatal care: As dating is not reliable, a dating ultrasound has been ordered. Dating tab updated. Pre-pregnancy weight updated. Expected weight gain this pregnancy is 11-20 pounds  Prenatal labs reviewed, notable for anemia with a hemoglobin of 9.9. Indications for referral to [facility] were reviewed and the patient does not meet criteria for referral.  Medication list reviewed and updated.  Recommended patient see a dentist for regular care.  Bleeding and pain precautions reviewed. Importance of prenatal vitamins reviewed.  Genetic screening offered. Patient opted  for: no screening. The patient has the following indications for aspirinto begin 81 mg at 12-16 weeks: One high risk condition: no single high risk condition  MORE than one moderate risk condition: obesity and identifies as  African American  Aspirin was  recommended today based upon above risk factors (one high risk condition or more than one moderate risk factor)  The patient will not be age 63 or over at time of delivery. Referral to genetic counseling was not offered today.  The patient has the following risk factors for preexisting diabetes: BMI > 25 and high risk ethnicity (Latino, [place] American, Native American, [place] Islander, Asian Naval architect) . An early 1 hour glucose tolerance test was ordered, but could not be completed during the visit and is scheduled for 10/26. Pregnancy Medical Home and PHQ-9 forms completed, problems noted: Yes  2. Pregnancy issues include the following which were addressed today:  Anemia.  Discussed with patient to pick up the iron supplementation that was prescribed at last visit. Risk factor for pre-existing diabetes.  Patient needs a 1 hour Glucola but could not be completed during the visit and is scheduled for 10/26. History of gestational HTN patient qualifies for aspirin, which was prescribed today. GC/Ch swab was collected today. Patient had both infections during last pregnancy.   Follow up 4 weeks for next prenatal visit.

## 2020-11-22 ENCOUNTER — Other Ambulatory Visit: Payer: Self-pay | Admitting: *Deleted

## 2020-11-22 DIAGNOSIS — Z3481 Encounter for supervision of other normal pregnancy, first trimester: Secondary | ICD-10-CM

## 2020-11-22 LAB — CERVICOVAGINAL ANCILLARY ONLY
Chlamydia: POSITIVE — AB
Comment: NEGATIVE
Comment: NORMAL
Neisseria Gonorrhea: NEGATIVE

## 2020-11-23 ENCOUNTER — Telehealth: Payer: Self-pay | Admitting: Family Medicine

## 2020-11-23 MED ORDER — AZITHROMYCIN 500 MG PO TABS: 1000.0000 mg | ORAL_TABLET | Freq: Once | ORAL | 0 refills | Status: AC

## 2020-11-23 NOTE — Telephone Encounter (Signed)
Patient contacted in regards to positive chlamydia results and medication. Patient advised medication is at [facility] ready for pick up. Patient reported she would rather be treated at [facility] apt here on 10/26. Patient declined partner treatment. Stressed the importance of obtaining medication and safe sex practices. All questions were answered.

## 2020-11-23 NOTE — Telephone Encounter (Signed)
Attempted to reach the patient regarding positive Chlamydia results. Patient does not currently have a phone and uses a family member's phone when she is at their house. Left the clinic phone number with them for her to call back.    Will send in 1g dose of Azithromycin for treatment. Patient previously did not want to involve her partner in the pregnancy and unsure if she is in contact with them. Will need discussion about possible partner therapy as well.   Ezmae Speers, DO

## 2020-11-28 ENCOUNTER — Encounter: Payer: Self-pay | Admitting: Family Medicine

## 2020-11-28 ENCOUNTER — Other Ambulatory Visit: Payer: Medicaid Other

## 2020-12-04 ENCOUNTER — Inpatient Hospital Stay: Admission: RE | Admit: 2020-12-04 | Payer: Medicaid Other | Source: Ambulatory Visit

## 2020-12-04 ENCOUNTER — Encounter: Payer: Self-pay | Admitting: Family Medicine

## 2020-12-11 ENCOUNTER — Ambulatory Visit
Admission: RE | Admit: 2020-12-11 | Discharge: 2020-12-11 | Disposition: A | Discharge status: Home / Self Care | Payer: Medicaid Other | Source: Ambulatory Visit | Attending: Family Medicine | Admitting: Family Medicine

## 2020-12-11 ENCOUNTER — Other Ambulatory Visit: Payer: Self-pay | Admitting: Family Medicine

## 2020-12-11 ENCOUNTER — Other Ambulatory Visit: Payer: Self-pay

## 2020-12-11 DIAGNOSIS — Z3481 Encounter for supervision of other normal pregnancy, first trimester: Secondary | ICD-10-CM | POA: Insufficient documentation

## 2020-12-11 DIAGNOSIS — Z3A12 12 weeks gestation of pregnancy: Secondary | ICD-10-CM | POA: Diagnosis not present

## 2020-12-11 DIAGNOSIS — O208 Other hemorrhage in early pregnancy: Secondary | ICD-10-CM | POA: Diagnosis not present

## 2020-12-19 ENCOUNTER — Encounter: Payer: Medicaid Other | Admitting: Family Medicine

## 2020-12-26 ENCOUNTER — Telehealth (INDEPENDENT_AMBULATORY_CARE_PROVIDER_SITE_OTHER): Payer: Medicaid Other

## 2020-12-26 ENCOUNTER — Other Ambulatory Visit: Payer: Self-pay

## 2020-12-26 DIAGNOSIS — Z3490 Encounter for supervision of normal pregnancy, unspecified, unspecified trimester: Secondary | ICD-10-CM

## 2020-12-26 NOTE — Progress Notes (Signed)
Called pt to begin virtual appt; VM left at [phone] stating I am calling to begin virtual appt and pt should return phone call or join visit. Called second number [phone]; VM is full, unable to leave message.  Called pt at [phone]; VM left stating patient should call to reschedule appt or respond to MyChart message. Called second number; pt's sister answered and states this is her phone number. States she is unsure pt has a phone number currently. Sister is in contact with pt, will notify her I called and give callback number.   MyChart message sent.

## 2021-01-08 ENCOUNTER — Encounter: Payer: Medicaid Other | Admitting: Family Medicine

## 2021-01-15 ENCOUNTER — Ambulatory Visit (INDEPENDENT_AMBULATORY_CARE_PROVIDER_SITE_OTHER): Payer: Medicaid Other | Admitting: Family Medicine

## 2021-01-15 ENCOUNTER — Encounter: Payer: Self-pay | Admitting: Family Medicine

## 2021-01-15 ENCOUNTER — Other Ambulatory Visit: Payer: Self-pay

## 2021-01-15 ENCOUNTER — Other Ambulatory Visit (HOSPITAL_COMMUNITY)
Admission: RE | Admit: 2021-01-15 | Discharge: 2021-01-15 | Disposition: A | Discharge status: Home / Self Care | Payer: Medicaid Other | Source: Ambulatory Visit | Attending: Family Medicine | Admitting: Family Medicine

## 2021-01-15 VITALS — BP 126/68 | HR 83 | Wt 220.2 lb

## 2021-01-15 DIAGNOSIS — O0990 Supervision of high risk pregnancy, unspecified, unspecified trimester: Secondary | ICD-10-CM

## 2021-01-15 DIAGNOSIS — O09899 Supervision of other high risk pregnancies, unspecified trimester: Secondary | ICD-10-CM | POA: Diagnosis not present

## 2021-01-15 DIAGNOSIS — D573 Sickle-cell trait: Secondary | ICD-10-CM

## 2021-01-15 DIAGNOSIS — Z31430 Encounter of female for testing for genetic disease carrier status for procreative management: Secondary | ICD-10-CM | POA: Diagnosis not present

## 2021-01-15 DIAGNOSIS — Z8619 Personal history of other infectious and parasitic diseases: Secondary | ICD-10-CM | POA: Insufficient documentation

## 2021-01-15 DIAGNOSIS — Q256 Stenosis of pulmonary artery: Secondary | ICD-10-CM

## 2021-01-15 DIAGNOSIS — Z2839 Other underimmunization status: Secondary | ICD-10-CM

## 2021-01-15 DIAGNOSIS — Z8759 Personal history of other complications of pregnancy, childbirth and the puerperium: Secondary | ICD-10-CM | POA: Insufficient documentation

## 2021-01-15 HISTORY — DX: Supervision of high risk pregnancy, unspecified, unspecified trimester: O09.90

## 2021-01-15 HISTORY — DX: Personal history of other complications of pregnancy, childbirth and the puerperium: Z87.59

## 2021-01-15 MED ORDER — PRENATAL VITAMIN 27-0.8 MG PO TABS: 1.0000 | ORAL_TABLET | Freq: Every day | ORAL | 11 refills | Status: DC

## 2021-01-15 MED ORDER — FERROUS SULFATE 325 (65 FE) MG PO TBEC: 325.0000 mg | DELAYED_RELEASE_TABLET | ORAL | 2 refills | Status: DC

## 2021-01-15 MED ORDER — BLOOD PRESSURE KIT DEVI: 1.0000 | 0 refills | Status: DC | PRN

## 2021-01-15 NOTE — Progress Notes (Signed)
Subjective:   Anita Hayes is a 20 y.o. G3P1001 at 24w5dby LMP, early ultrasound being seen today for her first obstetrical visit.  Her obstetrical history is significant for obesity and pregnancy induced hypertension. Patient does intend to breast feed. Pregnancy history fully reviewed.  Patient reports no complaints.  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       Past Surgical History:  Procedure Laterality Date   ADENOIDECTOMY     TONSILLECTOMY AND ADENOIDECTOMY     History reviewed. No pertinent family history. Social History   Tobacco Use   Smoking status: Never   Smokeless tobacco: Never  Substance Use Topics   Alcohol use: Never   Drug use: Never   No Known Allergies Current Outpatient Medications on File Prior to Visit  Medication Sig Dispense Refill   aspirin EC 81 MG tablet Take 1 tablet (81 mg total) by mouth daily. Swallow whole. 30 tablet 3   ferrous sulfate 325 (65 FE) MG EC tablet Take 1 tablet (325 mg total) by mouth every other day. 30 tablet 2   Prenatal Vit-Fe Fumarate-FA (PRENATAL VITAMIN PO) Take by mouth.     amLODipine (NORVASC) 5 MG tablet TAKE 1 TABLET (5 MG TOTAL) BY MOUTH DAILY. (Patient not taking: No sig reported) 30 tablet 1    Blood Pressure Monitoring (BLOOD PRESSURE KIT) DEVI 1 Device by Does not apply route as needed. (Patient not taking: Reported on 11/21/2020) 1 each 0   No current facility-administered medications on file prior to visit.     Exam   Vitals:   01/15/21 1432  BP: 126/68  Pulse: 83  Weight: 220 lb 3.2 oz (99.9 kg)   Fetal Heart Rate (bpm): 145  System: General: well-developed, well-nourished female in no acute distress   Skin: normal coloration and turgor, no rashes   Neurologic: oriented, normal, negative, normal mood   Extremities: normal strength, tone, and muscle mass, ROM of all joints is normal   HEENT PERRLA, extraocular movement intact and sclera clear, anicteric   Neck supple and no masses   Respiratory:  no respiratory distress      Assessment:   Pregnancy: G3P1001 Patient Active Problem List   Diagnosis Date Noted   Supervision of high risk pregnancy, antepartum 01/15/2021   History of gestational hypertension 01/15/2021   Positive pregnancy test 11/09/2020   Morbid obesity ([facility]ray03/11/2020   Late prenatal care 11/30/2019   Labor and delivery indication for care or intervention 11/30/2019  Encounter for induction of labor 11/30/2019   Gonorrhea affecting pregnancy 11/22/2019   History of chlamydia 11/17/2019   No prenatal care in current pregnancy in third trimester 10/20/2019   Sickle cell trait ([place]) 06/10/2019   Rubella non-immune status, antepartum 06/08/2019   Pregnancy 06/07/2019   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 Supervision of high risk pregnancy, antepartum BP and FHR normal Initial labs drawn. Continue prenatal vitamins. Genetic Screening discussed, NIPS: ordered. Ultrasound discussed; fetal anatomic survey: ordered. Problem list reviewed and updated. The nature of [place] with multiple MDs and other Advanced Practice Providers was explained to patient; also emphasized that residents,  students are part of our team. - [place] MFM OB DETAIL +14 [place]; Future  2. History of chlamydia Diagnosed two months prior Reports she did take treatment, no intercourse since that time TOC today  3. Sickle cell trait ([facility]) Abnormal hemoglobin screening from prior pregnancy Will provider partner testing kit  4. Rubella non-immune status, antepartum Offer MMR PP  5. History of gestational hypertension Not taking ASA Strongly encouraged to do so  6. Pulmonary artery stenosis Echo obtained due to murmur in 06/27/2014 Trivial on most recent echo at [facility] in 2016, no further workup required  Routine obstetric precautions reviewed. Return in about 4 weeks (around 02/12/2021) for [facility], ob visit.

## 2021-01-16 ENCOUNTER — Encounter: Payer: Self-pay | Admitting: *Deleted

## 2021-01-16 LAB — CERVICOVAGINAL ANCILLARY ONLY
Chlamydia: NEGATIVE
Comment: NEGATIVE
Comment: NEGATIVE
Comment: NORMAL
Neisseria Gonorrhea: NEGATIVE
Trichomonas: NEGATIVE

## 2021-01-17 LAB — AFP, SERUM, OPEN SPINA BIFIDA
AFP MoM: 1.32
AFP Value: 50.3 ng/mL
Gest. Age on Collection Date: 17.7 weeks
Maternal Age At EDD: 21.2 yr
OSBR Risk 1 IN: 9058
Test Results:: NEGATIVE
Weight: 220 [lb_av]

## 2021-01-17 LAB — HEMOGLOBIN A1C
Est. average glucose Bld gHb Est-mCnc: 105 mg/dL
Hgb A1c MFr Bld: 5.3 % (ref 4.8–5.6)

## 2021-01-30 ENCOUNTER — Telehealth: Payer: Self-pay | Admitting: Lactation Services

## 2021-01-30 DIAGNOSIS — D563 Thalassemia minor: Secondary | ICD-10-CM | POA: Insufficient documentation

## 2021-01-30 NOTE — Telephone Encounter (Signed)
Attempted to call patient to give results of [facility] Carrier Screening. Screening shows patient is carrier for Alpha thalassemia and Sickle Cell Disease. Received message x 2 that call could not be completed at this time.  My Chart message sent.

## 2021-02-03 NOTE — L&D Delivery Note (Deleted)
OBSTETRIC ADMISSION HISTORY AND PHYSICAL ? ?Anita Hayes is a 21 y.o. female G2P1001 with IUP at 43w0dby LMP presenting for IOL d/t IUGR. She reports +FMs, No LOF, no VB, no blurry vision, headaches or peripheral edema, and RUQ pain.  She plans on bottle feeding. She is undecided for birth control. ?[facility] ? ?Dating: By LMP --->  Estimated Date of Delivery: 06/20/21 ? ?Sono:   ? ?_0 , CWD, normal anatomy, Cephalic presentation,  10737T <1% EFW ? ? 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 ?Past Medical History: ?Past Medical History:  ?Diagnosis Date  ? Acne 07/16/2016  ? Anemia   ? Chlamydia infection affecting pregnancy in first trimester 11/21/2020  ? Gonorrhea affecting pregnancy 11/22/2019  ? [ ] tell peds at delivery [ ] toc late nov +11/17/19  ? History of chlamydia 11/17/2019  ? 9/14 positive, neg 10/16  ? Pregnancy   ? Pregnancy induced hypertension   ? GHTN with first preg  ? Pulmonary stenosis   ? Sickle cell trait ([place]   ? [place] cell [place]   ? 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 [facility]an[facility]hor[facility]  INTERPRETATION SUMMARY Trivial left pulmonary artery stenosis, peak gradient = 16 mmHg.      ? ? ?Past Surgical History: ?Past Surgical History:  ?Procedure Laterality Date  ? ADENOIDECTOMY    ? TONSILLECTOMY AND ADENOIDECTOMY    ? ? ?Obstetrical History: ?OB History   ? ? Gravida  ?2  ? Para  ?1  ? Term  ?1  ? Preterm  ?   ? AB  ?   ? Living  ?1  ?  ? ? SAB  ?   ? IAB  ?   ? Ectopic  ?   ? Multiple  ?0  ? Live Births  ?1  ?   ?  ?  ? ? ?Social History ?Social History  ? ?Socioeconomic History  ? Marital status: Single  ?  Spouse name: Not on file  ? Number of children: Not on file  ? Years of education: Not on file  ? Highest education level: Not  on file  ?Occupational History  ? Not on file  ?Tobacco Use  ? Smoking status: Never  ? Smokeless tobacco: Never  ?Vaping Use  ? Vaping Use: Never used  ?Substance and Sexual Activity  ? Alcohol use: Never  ? Drug use: Never  ? Sexual activity: Not Currently  ?Other Topics Concern  ? Not on file  ?Social History Narrative  ? ** Merged History Encounter **  ?    ? ?Social Determinants of Health  ? ?Financial Resource Strain: High Risk  ? Difficulty of Paying Living Expenses: Hard  ?Food Insecurity: Food Insecurity Present  ? Worried About RCharity fundraiserin the Last Year: Sometimes true  ? Ran Out of Food in the Last Year: Sometimes true  ?Transportation Needs: Unmet Transportation Needs  ? Lack of Transportation (Medical): Yes  ? Lack of Transportation (Non-Medical): Yes  ?Physical Activity: Not on file  ?Stress: Not on file  ?Social Connections: Not on file  ? ? ?Family History: ?Family History  ?Problem Relation Age of Onset  ? Other Mother   ?     died of covid 22022-01-27 ?  Other Father   ?     unknown hx  ? ? ?Allergies: ?No Known Allergies ? ?Pt denies allergies to latex, iodine, or shellfish. ? ?Medications Prior to Admission  ?Medication Sig Dispense Refill Last Dose  ? aspirin EC 81 MG tablet Take 1 tablet (81 mg total) by mouth daily. Swallow whole. 60 tablet 1   ? Blood Pressure Monitoring (BLOOD PRESSURE KIT) DEVI 1 Device by Does not apply route as needed. 1 each 0   ? ferrous sulfate 325 (65 FE) MG EC tablet Take 1 tablet (325 mg total) by mouth every other day. 30 tablet 2   ? Prenatal Vit-Fe Fumarate-FA (PRENATAL VITAMIN) 27-0.8 MG TABS Take 1 tablet by mouth daily. 30 tablet 11   ? ? ? ?Review of Systems  ? ?All systems reviewed and negative except as stated in HPI ? ?Blood pressure 134/74, pulse (!) 108, temperature 99 ?F (37.2 ?C), temperature source Oral, resp. rate 16, height 5' 3" (1.6 m), weight 106.7 kg, last menstrual period 09/13/2020, unknown if currently breastfeeding. ?General appearance:  alert, cooperative, and appears stated age ?Lungs: clear to auscultation bilaterally ?Heart: regular rate and rhythm ?Abdomen: soft, non-tender; bowel sounds normal ?Extremities: Homans sign is negative, no sign of DVT ?Presentation: cephalic ?Fetal monitoringBaseline: 150 bpm, Variability: Good {> 6 bpm), and Accelerations: Reactive ?Uterine activityNone ?  ? ? ?Prenatal labs: ?ABO, Rh: --/--/PENDING (04/27 3009) ?Antibody: PENDING (04/27 2330) ?Rubella: <0.90 (10/07 1104) ?RPR: Non Reactive (03/02 0909)  ?HBsAg: Negative (10/07 1104)  ?HIV: Non Reactive (03/02 0909)  ?GBS: Positive/-- (04/20 1602)  ?1 hr Glucola: Normal ?Genetic screening:  LR NIPS, Alpha thal and Sickle cell trait carrier  ?Anatomy US: Not obtained ? ?Prenatal Transfer Tool  ?Maternal Diabetes: No ?Genetic Screening: Normal ?Maternal Ultrasounds/Referrals: Normal ?Fetal Ultrasounds or other Referrals:  Fetal echo see care everywhere 05/24/2021  ?Maternal Substance Abuse:  No ?Significant Maternal Medications:  None ?Significant Maternal Lab Results: Group B Strep positive ? ?Results for orders placed or performed during the hospital encounter of 05/30/21 (from the past 24 hour(s))  ?Type and screen  ? Collection Time: 05/30/21  6:35 AM  ?Result Value Ref Range  ? ABO/RH(D) PENDING   ? Antibody Screen PENDING   ? Sample Expiration    ?  06/02/2021,2359 ?Performed at [facility], [place] [address], [place], [place] [address] ?  ? ? ?Patient Active Problem List  ? Diagnosis Date Noted  ? Fetal growth restriction antepartum 05/30/2021  ? Group B Streptococcus carrier, +RV culture, currently pregnant 05/27/2021  ? Need for follow-up by social worker 05/03/2021  ? Encounter for screening involving social determinants of health (SDoH) 05/03/2021  ? BMI 40.0-44.9, adult ([place]) 03/11/2021  ? IUGR (intrauterine growth restriction) affecting care of mother 03/01/2021  ? 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 Alpha thalassemia trait 01/30/2021  ?  Supervision of high risk pregnancy, antepartum 01/15/2021  ? History of gestational hypertension 01/15/2021  ? Maternal morbid obesity, antepartum ([place]) 04/12/2020  ? Late prenatal care 11/30/2019  ? Sickle cell trait ([place]) 06/10/2019  ? Rubella non-immune status, antepartum 06/08/2019  ? 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 ? ?Assessment/Plan:  ?Anita Hayes is a 21 y.o. G2P1001 at 15w0dhere for IOL 2/2 FGR ? ?#Labor:Patient is currently resting comfortable. 0.5 cm dilated and thick. Received first dose of Cytotec and will reassess in 3-4 hours with plan to consider FB depending on progress. ?#Pain: IV PRN, Epidural ?#FWB: Cat 1  ?#ID:  GBS positive on PCN ?#MOF: Bottle ?#MOC: Undecided  ?#Circ:  Yes, needs consent  ? ?Alen Bleacher, MD  ?[facility] [place] ?05/30/2021, 7:04 AM ? ?Attestation of Supervision of Student:  I confirm that I have verified the information documented in the  resident's  note and that I have also personally reperformed the history, physical exam and all medical decision making activities.  I have verified that all services and findings are accurately documented in this student's note; and I agree with management and plan as outlined in the documentation. I have also made any necessary editorial changes. ? ?Marcille Buffy DNP, CNM  ?05/30/21  10:03 AM  ? ? ?  ?

## 2021-02-03 NOTE — L&D Delivery Note (Addendum)
Delivery Note ?Progressed quickly to complete dilation with head on perineum   ?I arrived before baby delivered. ? ?At 4:42 AM a viable and healthy female was delivered via Vaginal, Spontaneous (Presentation: Right Occiput Anterior).  APGAR: 8, 9; weight  .   ?Placenta status: Pathology, Intact.  Cord:   with the following complications: Long.cord with nuchal x 2 (tight, delivered through) ? ?Anesthesia: Epidural ?Episiotomy: None ?Lacerations:  none ?Suture Repair:  none ?Est. Blood Loss (mL):  200 ? ?Peds attended delivery, deemed baby healthy and stable enough to stay with mother ? ?Mom to postpartum.  Baby to Couplet care / Skin to Skin. ? ?Wynelle Bourgeois ?05/31/2021, 5:09 AM ? ? ? ?

## 2021-02-05 ENCOUNTER — Inpatient Hospital Stay (HOSPITAL_COMMUNITY)
Admission: AD | Admit: 2021-02-05 | Discharge: 2021-02-05 | Disposition: A | Discharge status: Home / Self Care | Payer: Medicaid Other | Attending: Obstetrics and Gynecology | Admitting: Obstetrics and Gynecology

## 2021-02-05 ENCOUNTER — Encounter (HOSPITAL_COMMUNITY): Payer: Self-pay | Admitting: Obstetrics and Gynecology

## 2021-02-05 ENCOUNTER — Other Ambulatory Visit: Payer: Self-pay

## 2021-02-05 DIAGNOSIS — R531 Weakness: Secondary | ICD-10-CM | POA: Diagnosis not present

## 2021-02-05 DIAGNOSIS — R1012 Left upper quadrant pain: Secondary | ICD-10-CM | POA: Diagnosis not present

## 2021-02-05 DIAGNOSIS — D563 Thalassemia minor: Secondary | ICD-10-CM | POA: Insufficient documentation

## 2021-02-05 DIAGNOSIS — O0990 Supervision of high risk pregnancy, unspecified, unspecified trimester: Secondary | ICD-10-CM

## 2021-02-05 DIAGNOSIS — U071 COVID-19: Secondary | ICD-10-CM | POA: Insufficient documentation

## 2021-02-05 DIAGNOSIS — Z3A20 20 weeks gestation of pregnancy: Secondary | ICD-10-CM | POA: Diagnosis not present

## 2021-02-05 DIAGNOSIS — O09292 Supervision of pregnancy with other poor reproductive or obstetric history, second trimester: Secondary | ICD-10-CM | POA: Diagnosis not present

## 2021-02-05 DIAGNOSIS — O26892 Other specified pregnancy related conditions, second trimester: Secondary | ICD-10-CM | POA: Diagnosis not present

## 2021-02-05 DIAGNOSIS — O98512 Other viral diseases complicating pregnancy, second trimester: Secondary | ICD-10-CM | POA: Diagnosis not present

## 2021-02-05 DIAGNOSIS — R42 Dizziness and giddiness: Secondary | ICD-10-CM | POA: Diagnosis not present

## 2021-02-05 HISTORY — DX: Gestational (pregnancy-induced) hypertension without significant proteinuria, unspecified trimester: O13.9

## 2021-02-05 LAB — CBC WITH DIFFERENTIAL/PLATELET
Abs Immature Granulocytes: 0.03 10*3/uL (ref 0.00–0.07)
Basophils Absolute: 0 10*3/uL (ref 0.0–0.1)
Basophils Relative: 0 %
Eosinophils Absolute: 0 10*3/uL (ref 0.0–0.5)
Eosinophils Relative: 1 %
HCT: 32.9 % — ABNORMAL LOW (ref 36.0–46.0)
Hemoglobin: 10.3 g/dL — ABNORMAL LOW (ref 12.0–15.0)
Immature Granulocytes: 1 %
Lymphocytes Relative: 21 %
Lymphs Abs: 1.2 10*3/uL (ref 0.7–4.0)
MCH: 21.4 pg — ABNORMAL LOW (ref 26.0–34.0)
MCHC: 31.3 g/dL (ref 30.0–36.0)
MCV: 68.3 fL — ABNORMAL LOW (ref 80.0–100.0)
Monocytes Absolute: 0.2 10*3/uL (ref 0.1–1.0)
Monocytes Relative: 4 %
Neutro Abs: 4.3 10*3/uL (ref 1.7–7.7)
Neutrophils Relative %: 73 %
Platelets: 154 10*3/uL (ref 150–400)
RBC: 4.82 MIL/uL (ref 3.87–5.11)
RDW: 18.2 % — ABNORMAL HIGH (ref 11.5–15.5)
WBC: 5.9 10*3/uL (ref 4.0–10.5)
nRBC: 0 % (ref 0.0–0.2)

## 2021-02-05 LAB — URINALYSIS, ROUTINE W REFLEX MICROSCOPIC
Bilirubin Urine: NEGATIVE
Glucose, UA: NEGATIVE mg/dL
Hgb urine dipstick: NEGATIVE
Ketones, ur: NEGATIVE mg/dL
Leukocytes,Ua: NEGATIVE
Nitrite: NEGATIVE
Protein, ur: NEGATIVE mg/dL
Specific Gravity, Urine: 1.01 (ref 1.005–1.030)
pH: 6 (ref 5.0–8.0)

## 2021-02-05 LAB — RESP PANEL BY RT-PCR (FLU A&B, COVID) ARPGX2
Influenza A by PCR: NEGATIVE
Influenza B by PCR: NEGATIVE
SARS Coronavirus 2 by RT PCR: POSITIVE — AB

## 2021-02-05 MED ORDER — LACTATED RINGERS IV BOLUS
1000.0000 mL | Freq: Once | INTRAVENOUS | Status: AC
  Administered 2021-02-05: 1000 mL via INTRAVENOUS

## 2021-02-05 NOTE — MAU Provider Note (Signed)
History     CSN: [id number]  Arrival date and time: 02/05/21 0957   Event Date/Time   First Provider Initiated Contact with Patient 02/05/21 1055      Chief Complaint  Patient presents with   Nasal Congestion   Cough   Dizziness   Sore Throat        Abdominal Pain   Ms. Cynthie Garmon is a 21 y.o. G2P1001 at 27w5dwho presents to MAU for "feeling sick" which she describes as sneezing, coughing, runny nose and occasionally a sore throat, which she does not have right now. Patient states her cough is very loud. Patient coughed with provider in the room and coughed sounded within normal limits for typical URI. Patient reports her son is currently sick with an ear infection and also has a runny nose and coughing. Patient reports her son was sick first and then she got sick. Patient reports her symptoms started on 01/30/2021.  Patient reports she was at work today and started feeling weak and lightheaded when she was standing, which is what brought her in, but she does not feel weak or lightheaded at this time. Patient reports as soon as she sat down at work, the weakness and lightheadedness resolved. Patient states she had Pop-tarts, crackers and chips around 9AM this morning. Patient reports she has been awake since 5AM and did not have anything to eat for the first 4 hours of being awake. Patient reports one bottle of water and one bottle of gatorade to drink today.  Patient also reports a pinpoint area in the LUQ of her abdomen that she feels a cramping sensation in. Patient reports the cramping sensation started this morning and is constant. Patient declines a cervical exam at this time.  Pt denies VB, LOF, ctx, decreased FM, vaginal discharge/odor/itching. Pt denies N/V, abdominal pain, constipation, diarrhea, or urinary problems. Pt denies fever, chills, fatigue, sweating or changes in appetite. Pt denies SOB or chest pain. Pt denies dizziness, HA.  Problems this pregnancy include:  none. Allergies? none Current medications/supplements? PNV [facility] care provider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        Past Surgical History:  Procedure Laterality Date   ADENOIDECTOMY     TONSILLECTOMY AND ADENOIDECTOMY      Family History  Problem Relation Age of Onset   Other Mother        died of covid 22022-02-06  Other Father        unknown hx    Social History   Tobacco Use   Smoking status: Never   Smokeless tobacco: Never  Vaping Use   Vaping Use: Never used  Substance Use Topics   Alcohol use: Never   Drug use: Never    Allergies: No Known Allergies  Medications Prior to Admission  Medication Sig Dispense Refill Last Dose   Prenatal Vit-Fe Fumarate-FA (PRENATAL VITAMIN) 27-0.8 MG TABS Take 1 tablet by mouth daily. 30 tablet 11 02/05/2021   amLODipine (NORVASC) 5 MG tablet TAKE 1 TABLET (5 MG TOTAL) BY MOUTH DAILY. (Patient not taking: Reported on 11/09/2020) 30 tablet 1    aspirin EC 81 MG tablet Take 1 tablet (81 mg total) by mouth daily. Swallow whole. 30 tablet 3    Blood Pressure Monitoring (BLOOD PRESSURE KIT) DEVI 1 Device by Does not apply route as needed. (Patient not taking: Reported on 11/21/2020) 1 each 0    Blood Pressure Monitoring (BLOOD PRESSURE KIT) DEVI 1 Device by Does not apply route as needed. 1 each 0    ferrous sulfate 325 (65 FE) MG EC tablet Take 1 tablet  (325 mg total) by mouth every other day. 30 tablet 2     Review of Systems  Constitutional:  Negative for chills, diaphoresis, fatigue and fever.  HENT:  Positive for rhinorrhea, sneezing and sore throat.   Eyes:  Negative for visual disturbance.  Respiratory:  Positive for cough. Negative for shortness of breath.   Cardiovascular:  Negative for chest pain.  Gastrointestinal:  Negative for abdominal pain, constipation, diarrhea, nausea and vomiting.  Genitourinary:  Negative for dysuria, flank pain, frequency, pelvic pain, urgency, vaginal bleeding and vaginal discharge.  Neurological:  Positive for weakness and light-headedness. Negative for dizziness and headaches.   Physical Exam   Blood pressure 129/63, pulse 96, temperature 98 F (36.7 C), temperature source Oral, resp. rate 18, weight 100.6 kg, last menstrual period 09/13/2020, SpO2 100 %, unknown if currently breastfeeding.  Patient Vitals for the past 24 hrs:  BP Temp Temp src Pulse Resp SpO2 Weight  02/05/21 1257 -- -- -- -- -- 100 % --  02/05/21 1043 -- -- -- -- -- 97 % --  02/05/21 1016 129/63 98 F (36.7 C) Oral 96 18 100 % 100.6 kg  02/05/21 1014 -- -- -- -- -- 100 % --   Physical Exam Constitutional:      General: She is not in acute distress.    Appearance: She is well-developed. She is not ill-appearing, toxic-appearing or diaphoretic.  HENT:     Head: Normocephalic and atraumatic.  Pulmonary:     Effort: Pulmonary effort is normal. No respiratory distress.     Breath sounds: Normal breath sounds. No wheezing.  Abdominal:     General: There is no distension.     Palpations: Abdomen is soft. There is no mass.     Tenderness: There is no abdominal tenderness. There is no guarding or rebound.  Skin:    General: Skin is warm and dry.  Neurological:     Mental Status: She is alert and oriented to person, place, and time.  Psychiatric:        Mood and Affect: Mood normal.        Behavior: Behavior normal.         Thought Content: Thought content normal.        Judgment: Judgment normal.   Results for orders placed or performed during the hospital encounter of 02/05/21 (from the past 24 hour(s))  Urinalysis, Routine w reflex microscopic Urine, Clean Catch     Status: None   Collection Time:  02/05/21 10:43 AM  Result Value Ref Range   Color, Urine YELLOW YELLOW   APPearance CLEAR CLEAR   Specific Gravity, Urine 1.010 1.005 - 1.030   pH 6.0 5.0 - 8.0   Glucose, UA NEGATIVE NEGATIVE mg/dL   Hgb urine dipstick NEGATIVE NEGATIVE   Bilirubin Urine NEGATIVE NEGATIVE   Ketones, ur NEGATIVE NEGATIVE mg/dL   Protein, ur NEGATIVE NEGATIVE mg/dL   Nitrite NEGATIVE NEGATIVE   Leukocytes,Ua NEGATIVE NEGATIVE  Resp Panel by RT-PCR (Flu A&B, Covid) Nasopharyngeal Swab     Status: Abnormal   Collection Time: 02/05/21 11:15 AM   Specimen: Nasopharyngeal Swab; Nasopharyngeal(NP) swabs in vial transport medium  Result Value Ref Range   SARS Coronavirus 2 by RT PCR POSITIVE (A) NEGATIVE   Influenza A by PCR NEGATIVE NEGATIVE   Influenza B by PCR NEGATIVE NEGATIVE  CBC with Differential/Platelet     Status: Abnormal   Collection Time: 02/05/21 11:21 AM  Result Value Ref Range   WBC 5.9 4.0 - 10.5 K/uL   RBC 4.82 3.87 - 5.11 MIL/uL   Hemoglobin 10.3 (L) 12.0 - 15.0 g/dL   HCT 32.9 (L) 36.0 - 46.0 %   MCV 68.3 (L) 80.0 - 100.0 fL   MCH 21.4 (L) 26.0 - 34.0 pg   MCHC 31.3 30.0 - 36.0 g/dL   RDW 18.2 (H) 11.5 - 15.5 %   Platelets 154 150 - 400 K/uL   nRBC 0.0 0.0 - 0.2 %   Neutrophils Relative % 73 %   Neutro Abs 4.3 1.7 - 7.7 K/uL   Lymphocytes Relative 21 %   Lymphs Abs 1.2 0.7 - 4.0 K/uL   Monocytes Relative 4 %   Monocytes Absolute 0.2 0.1 - 1.0 K/uL   Eosinophils Relative 1 %   Eosinophils Absolute 0.0 0.0 - 0.5 K/uL   Basophils Relative 0 %   Basophils Absolute 0.0 0.0 - 0.1 K/uL   Immature Granulocytes 1 %   Abs Immature Granulocytes 0.03 0.00 - 0.07 K/uL   No results found.  MAU Course   Procedures  MDM -URI symptoms with pinpoint area of cramping in upper abdomen -lungs clear -abdominal exam normal -UA: WNL -COVID: POSITIVE -Flu: negative -CBC: WNL for pregnancy, pt has iron ordered Orthostatic VS for the past 24 hrs:  BP- Lying Pulse- Lying BP- Sitting Pulse- Sitting BP- Standing at 0 minutes Pulse- Standing at 0 minutes  02/05/21 1257 119/61 75 111/64 73 114/57 73  02/05/21 1043 125/61 94 124/52 98 122/44 116  -initial orthostatics had change in pulse of >30bpm between lying and standing at 66mn, systolic BP drop of [id number]-[id number] LR bolus given -repeat ortho statics after bolus with stable pulse and BP -no treatment for COVID with symptoms >5days -pt discharged to home in stable condition  Orders Placed This Encounter  Procedures   Resp Panel by RT-PCR (Flu A&B, Covid) Nasopharyngeal Swab    Standing Status:   Standing    Number of Occurrences:   1   Urinalysis, Routine w reflex microscopic Urine, Clean Catch    Standing Status:   Standing    Number of Occurrences:   1   CBC with Differential/Platelet    Standing Status:   Standing    Number of Occurrences:   1   Airborne / Contact ([place]) Isolation    Standing Status:   Standing    Number of Occurrences:   1   Insert peripheral IV    Standing Status:   Standing  Number of Occurrences:   1   Discharge patient    Order Specific Question:   Discharge disposition    Answer:   01-Home or Self Care [1]    Order Specific Question:   Discharge patient date    Answer:   02/05/2021   Meds ordered this encounter  Medications   lactated ringers bolus 1,000 mL   Assessment and Plan   1. Lab test positive for detection of COVID-19 virus   2. Supervision of high risk pregnancy, antepartum   3. Alpha thalassemia trait   4. [redacted] weeks gestation of pregnancy   5. Orthostatic lightheadedness     Allergies as of 02/05/2021   No Known Allergies      Medication List     TAKE these medications    amLODipine  5 MG tablet Commonly known as: NORVASC TAKE 1 TABLET (5 MG TOTAL) BY MOUTH DAILY.   aspirin EC 81 MG tablet Take 1 tablet (81 mg total) by mouth daily. Swallow whole.   Blood Pressure Kit Devi 1 Device by Does not apply route as needed. What changed: Another medication with the same name was removed. Continue taking this medication, and follow the directions you see here.   ferrous sulfate 325 (65 FE) MG EC tablet Take 1 tablet (325 mg total) by mouth every other day.   Prenatal Vitamin 27-0.8 MG Tabs Take 1 tablet by mouth daily.       -discussed appropriate nutrition in pregnancy -discussed compression stockings -pt advised to pick up iron and pay for medication as pharmacy was called and confirmed insurance would not cover it -work note given per pt request with COVID+ results stated for work I have explained to the patient that because they have tested positive for COVID, they should do the following: -Reviewed results of positive COVID test with patient. Discussed typical course of virus and what to expect. Informed patient that symptoms tend to worsen on days 5-8 and reviewed safe medications for symptom management. Warning signs of when to return to MAU reviewed at length including shortness of breath, chest pain and decreased fetal movement. Instructed patient to quarantine for 10 days and any office appointments will be changed to virtual or rescheduled to outside of the quarantine window. -return MAU/ED precautions given -pt discharged to home in stable condition  Elmyra Ricks E Riki Gehring 02/05/2021, 1:41 PM

## 2021-02-05 NOTE — Discharge Instructions (Signed)

## 2021-02-05 NOTE — MAU Note (Signed)
Son got her sick.  Coughing, sneezing.  Denies fever.  Started before New Years.  Was feeling dizzy and light headed today at work.  Started feeling cramping in her upper abd, left side today. Sometimes she has a sore throat.

## 2021-02-12 ENCOUNTER — Ambulatory Visit: Payer: Medicaid Other | Attending: Family Medicine

## 2021-02-12 ENCOUNTER — Telehealth: Payer: Self-pay | Admitting: Family Medicine

## 2021-02-12 ENCOUNTER — Encounter: Payer: Self-pay | Admitting: Family Medicine

## 2021-02-12 ENCOUNTER — Telehealth (INDEPENDENT_AMBULATORY_CARE_PROVIDER_SITE_OTHER): Payer: Medicaid Other | Admitting: Family Medicine

## 2021-02-12 ENCOUNTER — Ambulatory Visit: Payer: Medicaid Other

## 2021-02-12 DIAGNOSIS — D563 Thalassemia minor: Secondary | ICD-10-CM

## 2021-02-12 DIAGNOSIS — O09899 Supervision of other high risk pregnancies, unspecified trimester: Secondary | ICD-10-CM

## 2021-02-12 DIAGNOSIS — Z8759 Personal history of other complications of pregnancy, childbirth and the puerperium: Secondary | ICD-10-CM

## 2021-02-12 DIAGNOSIS — O0990 Supervision of high risk pregnancy, unspecified, unspecified trimester: Secondary | ICD-10-CM

## 2021-02-12 DIAGNOSIS — Q256 Stenosis of pulmonary artery: Secondary | ICD-10-CM

## 2021-02-12 DIAGNOSIS — Z2839 Other underimmunization status: Secondary | ICD-10-CM

## 2021-02-12 DIAGNOSIS — D573 Sickle-cell trait: Secondary | ICD-10-CM

## 2021-02-12 DIAGNOSIS — Z8619 Personal history of other infectious and parasitic diseases: Secondary | ICD-10-CM

## 2021-02-12 DIAGNOSIS — Z91199 Patient's noncompliance with other medical treatment and regimen due to unspecified reason: Secondary | ICD-10-CM

## 2021-02-12 NOTE — Telephone Encounter (Signed)
Called patient to reschedule appointment from 02/12/21, the call could not be completed so a MyChart message was sent to the patient to call the office with a call back number listed.

## 2021-02-12 NOTE — Progress Notes (Signed)
Patient logged out of visit before I got to room to speak with her, we attempted to call x2 with no answer, will send message to admin staff to reschedule

## 2021-02-12 NOTE — Patient Instructions (Signed)

## 2021-02-12 NOTE — Progress Notes (Signed)
Connected with patient virtually, verified by 2 identifiers. Patient has no concerns at this time, reports good fetal movement. Patient unable to check blood pressure or weight at this time.   Wynona Canes, CMA

## 2021-02-18 ENCOUNTER — Encounter: Payer: Self-pay | Admitting: Obstetrics and Gynecology

## 2021-02-18 ENCOUNTER — Ambulatory Visit (INDEPENDENT_AMBULATORY_CARE_PROVIDER_SITE_OTHER): Payer: Medicaid Other | Admitting: Obstetrics and Gynecology

## 2021-02-18 ENCOUNTER — Other Ambulatory Visit: Payer: Self-pay

## 2021-02-18 VITALS — BP 122/58 | HR 88 | Wt 222.8 lb

## 2021-02-18 DIAGNOSIS — D563 Thalassemia minor: Secondary | ICD-10-CM

## 2021-02-18 DIAGNOSIS — Z6839 Body mass index (BMI) 39.0-39.9, adult: Secondary | ICD-10-CM

## 2021-02-18 DIAGNOSIS — Q256 Stenosis of pulmonary artery: Secondary | ICD-10-CM

## 2021-02-18 DIAGNOSIS — Z5941 Food insecurity: Secondary | ICD-10-CM

## 2021-02-18 DIAGNOSIS — Q249 Congenital malformation of heart, unspecified: Secondary | ICD-10-CM

## 2021-02-18 DIAGNOSIS — O0990 Supervision of high risk pregnancy, unspecified, unspecified trimester: Secondary | ICD-10-CM

## 2021-02-18 DIAGNOSIS — O99210 Obesity complicating pregnancy, unspecified trimester: Secondary | ICD-10-CM

## 2021-02-18 DIAGNOSIS — D573 Sickle-cell trait: Secondary | ICD-10-CM

## 2021-02-18 DIAGNOSIS — O98512 Other viral diseases complicating pregnancy, second trimester: Secondary | ICD-10-CM

## 2021-02-18 DIAGNOSIS — Z8759 Personal history of other complications of pregnancy, childbirth and the puerperium: Secondary | ICD-10-CM

## 2021-02-18 DIAGNOSIS — U071 COVID-19: Secondary | ICD-10-CM | POA: Insufficient documentation

## 2021-02-18 MED ORDER — ASPIRIN EC 81 MG PO TBEC: 81.0000 mg | DELAYED_RELEASE_TABLET | Freq: Every day | ORAL | 1 refills | Status: DC

## 2021-02-18 NOTE — Progress Notes (Signed)
Patient anatomy ultrasound rescheduled for 02/28/21 at 2 PM. Patient notified.

## 2021-02-20 NOTE — Progress Notes (Signed)
° °  PRENATAL VISIT NOTE  Subjective:  Anita Hayes is a 21 y.o. G2P1001 at [redacted]w[redacted]d being seen today for ongoing prenatal care.  She is currently monitored for the following issues for this high-risk pregnancy and has Pulmonary artery stenosis; Rubella non-immune status, antepartum; Sickle cell trait ([place]); Late prenatal care; Obesity in pregnancy; Supervision of high risk pregnancy, antepartum; History of gestational hypertension; Alpha thalassemia trait; Congenital heart disease in adult; and COVID-19 affecting pregnancy in second trimester on their problem list.  Patient reports no complaints.  Contractions: Not present. Vag. Bleeding: None.  Movement: Present. Denies leaking of fluid.   The following portions of the patient's history were reviewed and updated as appropriate: allergies, current medications, past family history, past medical history, past social history, past surgical history and problem list.   Objective:   Vitals:   02/18/21 1555  BP: (!) 122/58  Pulse: 88  Weight: 222 lb 12.8 oz (101.1 kg)    Fetal Status: Fetal Heart Rate (bpm): 160   Movement: Present     General:  Alert, oriented and cooperative. Patient is in no acute distress.  Skin: Skin is warm and dry. No rash noted.   Cardiovascular: Normal heart rate noted  Respiratory: Normal respiratory effort, no problems with respiration noted  Abdomen: Soft, gravid, appropriate for gestational age.  Pain/Pressure: Absent     Pelvic: Cervical exam deferred        Extremities: Normal range of motion.  Edema: None  Mental Status: Normal mood and affect. Normal behavior. Normal judgment and thought content.   Assessment and Plan:  Pregnancy: G2P1001 at [redacted]w[redacted]d 1. Food insecurity - AMBULATORY REFERRAL TO [facility]  2. Supervision of high risk pregnancy, antepartum Patient had to miss her 1/10 anatomy u/s. To be rescheduled by RN today - Ambulatory referral to Pediatric Cardiology  3. Congenital heart disease  in adult Seen by her cardiologist (Dr. Gasper Sells) April 12, 2020 and no issues; pt also had uncomplicated prenatal care, L&D for her 2021 pregnancy. Plan from Dr. Gasper Sells was for q3y echo and to see patient in 2024; 11/2019 echo showed "stable PV gradient" and normal EF.   Pt denies any cardiac, pulmonary s/s. I told her to come to hospital with any. Will refer to [facility] cards for follow up, too.  - Ambulatory referral to Pediatric Cardiology - AMB Referral to [facility]  4. Pulmonary artery stenosis - AMB Referral to [facility]  5. History of gestational hypertension Pt amenable to low dose ASA  6. Obesity in pregnancy  7. BMI 39.0-39.9,adult  8. Sickle cell trait ([place]) Needs third trimester surveillance ucx  Preterm labor symptoms and general obstetric precautions including but not limited to vaginal bleeding, contractions, leaking of fluid and fetal movement were reviewed in detail with the patient. Please refer to After Visit Summary for other counseling recommendations.   Return in about 3 weeks (around 03/11/2021) for in person, high risk ob, md or app.  Future Appointments  Date Time Provider [place]  02/28/2021  2:00 PM [facility] NURSE [facility]  02/28/2021  2:15 PM [facility] US2 [facility]  03/11/2021  3:55 PM Aletha Halim, MD [facility]    Aletha Halim, MD

## 2021-02-28 ENCOUNTER — Ambulatory Visit (HOSPITAL_BASED_OUTPATIENT_CLINIC_OR_DEPARTMENT_OTHER): Payer: Medicaid Other

## 2021-02-28 ENCOUNTER — Other Ambulatory Visit: Payer: Self-pay

## 2021-02-28 ENCOUNTER — Telehealth: Payer: Self-pay

## 2021-02-28 ENCOUNTER — Ambulatory Visit: Payer: Medicaid Other | Attending: Family Medicine | Admitting: *Deleted

## 2021-02-28 VITALS — BP 114/55 | HR 89

## 2021-02-28 DIAGNOSIS — O0990 Supervision of high risk pregnancy, unspecified, unspecified trimester: Secondary | ICD-10-CM

## 2021-02-28 DIAGNOSIS — E669 Obesity, unspecified: Secondary | ICD-10-CM | POA: Insufficient documentation

## 2021-02-28 DIAGNOSIS — O0932 Supervision of pregnancy with insufficient antenatal care, second trimester: Secondary | ICD-10-CM | POA: Insufficient documentation

## 2021-02-28 DIAGNOSIS — Z3A24 24 weeks gestation of pregnancy: Secondary | ICD-10-CM | POA: Insufficient documentation

## 2021-02-28 DIAGNOSIS — O0992 Supervision of high risk pregnancy, unspecified, second trimester: Secondary | ICD-10-CM | POA: Insufficient documentation

## 2021-02-28 DIAGNOSIS — Z148 Genetic carrier of other disease: Secondary | ICD-10-CM | POA: Insufficient documentation

## 2021-02-28 DIAGNOSIS — O99212 Obesity complicating pregnancy, second trimester: Secondary | ICD-10-CM | POA: Insufficient documentation

## 2021-02-28 DIAGNOSIS — D563 Thalassemia minor: Secondary | ICD-10-CM

## 2021-02-28 NOTE — Telephone Encounter (Signed)
Patient called in for [facility] Transportation - schedule [employer] for Detail, Anatomy ultrasound today at 2pm - made patient aware that future appointments with [place] - she would need to set up Medicaid Transportation 2 Days Prior!

## 2021-03-01 ENCOUNTER — Other Ambulatory Visit: Payer: Self-pay | Admitting: *Deleted

## 2021-03-01 ENCOUNTER — Encounter: Payer: Self-pay | Admitting: Family Medicine

## 2021-03-01 DIAGNOSIS — O365990 Maternal care for other known or suspected poor fetal growth, unspecified trimester, not applicable or unspecified: Secondary | ICD-10-CM

## 2021-03-01 DIAGNOSIS — Z362 Encounter for other antenatal screening follow-up: Secondary | ICD-10-CM

## 2021-03-01 DIAGNOSIS — Z6839 Body mass index (BMI) 39.0-39.9, adult: Secondary | ICD-10-CM

## 2021-03-01 HISTORY — DX: Maternal care for other known or suspected poor fetal growth, unspecified trimester, not applicable or unspecified: O36.5990

## 2021-03-08 ENCOUNTER — Encounter: Payer: Self-pay | Admitting: *Deleted

## 2021-03-11 ENCOUNTER — Other Ambulatory Visit: Payer: Self-pay

## 2021-03-11 ENCOUNTER — Other Ambulatory Visit: Payer: Self-pay | Admitting: General Practice

## 2021-03-11 ENCOUNTER — Ambulatory Visit (INDEPENDENT_AMBULATORY_CARE_PROVIDER_SITE_OTHER): Payer: Medicaid Other | Admitting: Obstetrics and Gynecology

## 2021-03-11 VITALS — BP 118/58 | HR 80 | Wt 227.9 lb

## 2021-03-11 DIAGNOSIS — Z6841 Body Mass Index (BMI) 40.0 and over, adult: Secondary | ICD-10-CM

## 2021-03-11 DIAGNOSIS — Z3A25 25 weeks gestation of pregnancy: Secondary | ICD-10-CM

## 2021-03-11 DIAGNOSIS — O365920 Maternal care for other known or suspected poor fetal growth, second trimester, not applicable or unspecified: Secondary | ICD-10-CM

## 2021-03-11 DIAGNOSIS — Z8759 Personal history of other complications of pregnancy, childbirth and the puerperium: Secondary | ICD-10-CM

## 2021-03-11 DIAGNOSIS — O0990 Supervision of high risk pregnancy, unspecified, unspecified trimester: Secondary | ICD-10-CM

## 2021-03-11 DIAGNOSIS — D573 Sickle-cell trait: Secondary | ICD-10-CM

## 2021-03-11 DIAGNOSIS — Q256 Stenosis of pulmonary artery: Secondary | ICD-10-CM

## 2021-03-11 DIAGNOSIS — Q249 Congenital malformation of heart, unspecified: Secondary | ICD-10-CM

## 2021-03-11 DIAGNOSIS — O99210 Obesity complicating pregnancy, unspecified trimester: Secondary | ICD-10-CM

## 2021-03-11 HISTORY — DX: Body Mass Index (BMI) 40.0 and over, adult: Z684

## 2021-03-11 NOTE — Progress Notes (Signed)
Updated Pts contact phone number & explained that [facility] will be contacting her to schedule the Fetal Echo. Pt verbalized understanding.  Fetal Echo sched. For 04/09/21@ 9:30a, w/ Dr. Lance Bosch at [facility].

## 2021-03-12 NOTE — Progress Notes (Signed)
PRENATAL VISIT NOTE  Subjective:  Anita Hayes is a 21 y.o. G2P1001 at [redacted]w[redacted]d being seen today for ongoing prenatal care.  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  Patient reports no complaints.  Contractions: Not present. Vag. Bleeding: None.  Movement: Present. Denies leaking of fluid.   The following portions of the patient's history were reviewed and updated as appropriate: allergies, current medications, past family history, past medical history, past social history, past surgical history and problem list.   Objective:   Vitals:   03/11/21 1559  BP: (!) 118/58  Pulse: 80  Weight: 227 lb 14.4 oz (103.4 kg)    Fetal Status: Fetal Heart Rate (bpm): 154   Movement: Present     General:  Alert, oriented and cooperative. Patient is in no acute distress.  Skin: Skin is warm and dry. No rash noted.   Cardiovascular: Normal heart rate noted  Respiratory: Normal respiratory effort, no problems with respiration noted  Abdomen: Soft, gravid, appropriate for gestational age.  Pain/Pressure: Absent     Pelvic: Cervical exam deferred        Extremities: Normal range of motion.  Edema: None  Mental Status: Normal mood and affect. Normal behavior. Normal judgment and thought content.   Assessment and Plan:  Pregnancy: G2P1001 at [redacted]w[redacted]d 1. [redacted] weeks gestation of pregnancy 2h GTT nv Patient unable to be contacted for peds and adult cardiology visit. Pt states her number is incorrect and this was updated (see  below) - US Fetal Echocardiography; Future  2. 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 Servando Salina surveillance visit made). Pt last seen by hercardiologist (Dr. Izora Ribas) April 12, 2020 and no issues; pt also had uncomplicated prenatal care, L&D for her 2021 pregnancy. Plan from Dr. Izora Ribas was for q3y echo and to see patient in 2024; 11/2019 echo showed  -screening fetal echo scheduled for 3/7 at 0930 with Dr. Lance Bosch - US Fetal Echocardiography; Future  3. Supervision of high risk pregnancy, antepartum  4. Sickle cell trait (HCC) Surveillance ucx nv  5. Obesity in pregnancy   6. BMI 40.0-44.9, adult (HCC)   7. Poor fetal growth affecting management of mother in second trimester, single or unspecified fetus FKC precautions given. Has rpt growth later this week and dopplers.  1/26: 10%, 562gm, ac 18%, afi wnl, ua doppler wnl  8. Pulmonary artery stenosis - US Fetal Echocardiography; Future  9. History of gestational hypertension Continue low dose asa  Preterm labor symptoms and general obstetric precautions including but not limited to vaginal bleeding, contractions, leaking of fluid and fetal movement were reviewed in detail with the patient. Please refer to After Visit Summary for other counseling recommendations.   Return in about 2 weeks (around 03/25/2021) for in person, fasting 2hr GTT, md visit.  Future Appointments  Date Time Provider Department Center  03/15/2021  1:30 PM [facility] NURSE [facility]  03/15/2021  1:45 PM [facility] US4 [facility]  03/21/2021  1:30 PM [facility] NURSE [facility]  03/21/2021  1:45 PM [facility] US4 [facility]  04/04/2021  8:15 AM Alysia Penna, Marolyn Hammock,  MD [facility]  04/04/2021  8:40 AM [facility] LAB [facility]    Aletha Halim, MD

## 2021-03-15 ENCOUNTER — Ambulatory Visit: Payer: Medicaid Other | Attending: Obstetrics

## 2021-03-15 ENCOUNTER — Telehealth: Payer: Self-pay

## 2021-03-15 ENCOUNTER — Ambulatory Visit: Payer: Medicaid Other

## 2021-03-15 NOTE — Telephone Encounter (Signed)
Patient called to let us know that transportation was running late - I moved her to the 2pm ultrasound appointment and told her she needed to be here and register by 2pm.

## 2021-03-15 NOTE — Telephone Encounter (Signed)
Called patient to let her know it was to far past her appointment time on 03/15/21 - we will see her next Thursday 03/21/21

## 2021-03-21 ENCOUNTER — Other Ambulatory Visit: Payer: Self-pay

## 2021-03-21 ENCOUNTER — Ambulatory Visit: Payer: Medicaid Other | Attending: Obstetrics

## 2021-03-21 ENCOUNTER — Other Ambulatory Visit: Payer: Self-pay | Admitting: *Deleted

## 2021-03-21 ENCOUNTER — Ambulatory Visit: Payer: Medicaid Other | Admitting: *Deleted

## 2021-03-21 VITALS — BP 109/50 | HR 90

## 2021-03-21 DIAGNOSIS — Z3A27 27 weeks gestation of pregnancy: Secondary | ICD-10-CM | POA: Diagnosis not present

## 2021-03-21 DIAGNOSIS — O0932 Supervision of pregnancy with insufficient antenatal care, second trimester: Secondary | ICD-10-CM | POA: Diagnosis not present

## 2021-03-21 DIAGNOSIS — O0992 Supervision of high risk pregnancy, unspecified, second trimester: Secondary | ICD-10-CM | POA: Diagnosis not present

## 2021-03-21 DIAGNOSIS — O365920 Maternal care for other known or suspected poor fetal growth, second trimester, not applicable or unspecified: Secondary | ICD-10-CM

## 2021-03-21 DIAGNOSIS — O99212 Obesity complicating pregnancy, second trimester: Secondary | ICD-10-CM

## 2021-03-21 DIAGNOSIS — O0990 Supervision of high risk pregnancy, unspecified, unspecified trimester: Secondary | ICD-10-CM | POA: Diagnosis present

## 2021-03-21 DIAGNOSIS — D563 Thalassemia minor: Secondary | ICD-10-CM

## 2021-03-21 DIAGNOSIS — Z362 Encounter for other antenatal screening follow-up: Secondary | ICD-10-CM | POA: Diagnosis present

## 2021-03-21 DIAGNOSIS — Z6839 Body mass index (BMI) 39.0-39.9, adult: Secondary | ICD-10-CM | POA: Diagnosis present

## 2021-03-21 DIAGNOSIS — Z8759 Personal history of other complications of pregnancy, childbirth and the puerperium: Secondary | ICD-10-CM

## 2021-03-21 DIAGNOSIS — Z148 Genetic carrier of other disease: Secondary | ICD-10-CM | POA: Diagnosis not present

## 2021-03-21 DIAGNOSIS — O09292 Supervision of pregnancy with other poor reproductive or obstetric history, second trimester: Secondary | ICD-10-CM | POA: Insufficient documentation

## 2021-04-04 ENCOUNTER — Ambulatory Visit (INDEPENDENT_AMBULATORY_CARE_PROVIDER_SITE_OTHER): Payer: Medicaid Other | Admitting: Obstetrics and Gynecology

## 2021-04-04 ENCOUNTER — Other Ambulatory Visit: Payer: Medicaid Other

## 2021-04-04 ENCOUNTER — Other Ambulatory Visit: Payer: Self-pay

## 2021-04-04 ENCOUNTER — Encounter: Payer: Self-pay | Admitting: Lactation Services

## 2021-04-04 ENCOUNTER — Encounter: Payer: Self-pay | Admitting: Obstetrics and Gynecology

## 2021-04-04 VITALS — BP 114/69 | HR 93 | Wt 230.3 lb

## 2021-04-04 DIAGNOSIS — Z23 Encounter for immunization: Secondary | ICD-10-CM | POA: Diagnosis not present

## 2021-04-04 DIAGNOSIS — D573 Sickle-cell trait: Secondary | ICD-10-CM

## 2021-04-04 DIAGNOSIS — O09899 Supervision of other high risk pregnancies, unspecified trimester: Secondary | ICD-10-CM

## 2021-04-04 DIAGNOSIS — Q256 Stenosis of pulmonary artery: Secondary | ICD-10-CM

## 2021-04-04 DIAGNOSIS — O0990 Supervision of high risk pregnancy, unspecified, unspecified trimester: Secondary | ICD-10-CM

## 2021-04-04 DIAGNOSIS — Z658 Other specified problems related to psychosocial circumstances: Secondary | ICD-10-CM

## 2021-04-04 DIAGNOSIS — F4321 Adjustment disorder with depressed mood: Secondary | ICD-10-CM

## 2021-04-04 DIAGNOSIS — Q249 Congenital malformation of heart, unspecified: Secondary | ICD-10-CM

## 2021-04-04 DIAGNOSIS — O365920 Maternal care for other known or suspected poor fetal growth, second trimester, not applicable or unspecified: Secondary | ICD-10-CM

## 2021-04-04 DIAGNOSIS — Z8759 Personal history of other complications of pregnancy, childbirth and the puerperium: Secondary | ICD-10-CM

## 2021-04-04 DIAGNOSIS — Z2839 Other underimmunization status: Secondary | ICD-10-CM

## 2021-04-04 LAB — POCT URINALYSIS DIP (DEVICE)
Bilirubin Urine: NEGATIVE
Glucose, UA: NEGATIVE mg/dL
Hgb urine dipstick: NEGATIVE
Ketones, ur: NEGATIVE mg/dL
Leukocytes,Ua: NEGATIVE
Nitrite: NEGATIVE
Protein, ur: NEGATIVE mg/dL
Specific Gravity, Urine: 1.02 (ref 1.005–1.030)
Urobilinogen, UA: 0.2 mg/dL (ref 0.0–1.0)
pH: 6.5 (ref 5.0–8.0)

## 2021-04-04 NOTE — Patient Instructions (Signed)

## 2021-04-04 NOTE — Progress Notes (Signed)
Subjective:  ?Anita Hayes is a 21 y.o. G2P1001 at [redacted]w[redacted]d being seen today for ongoing prenatal care.  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 ? ?Patient reports no complaints.  Contractions: Not present. Vag. Bleeding: None.  Movement: Present. Denies leaking of fluid.  ? ?The following portions of the patient's history were reviewed and updated as appropriate: allergies, current medications, past family history, past medical history, past social history, past surgical history and problem list. Problem list updated. ? ?Objective:  ? ?Vitals:  ? 04/04/21 0845  ?BP: 114/69  ?Pulse: 93  ?Weight: 230 lb 4.8 oz (104.5 kg)  ? ? ?Fetal Status: Fetal Heart Rate (bpm): 147   Movement: Present    ? ?General:  Alert, oriented and cooperative. Patient is in no acute distress.  ?Skin: Skin is warm and dry. No rash noted.   ?Cardiovascular: Normal heart rate noted  ?Respiratory: Normal respiratory effort, no problems with respiration noted  ?Abdomen: Soft, gravid, appropriate for gestational age. Pain/Pressure: Absent     ?Pelvic:  Cervical exam deferred        ?Extremities: Normal range of motion.  Edema: None  ?Mental Status: Normal mood and affect. Normal behavior. Normal judgment and thought content.  ? ?Urinalysis:     ? ?Assessment and Plan:  ?Pregnancy: G2P1001 at [redacted]w[redacted]d ? ?1. Supervision of high risk pregnancy, antepartum ?Stable ?- CBC ?- Glucose Tolerance, 2 Hours w/1 Hour ?- HIV Antibody (routine testing w rflx) ?- RPR ?- Tdap  vaccine greater than or equal to 7yo IM ? ?2. 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 ?3. Poor fetal growth affecting management of mother in second trimester, single or unspecified fetus ?Serial growth scans as per MFM ? ?4. Pulmonary artery stenosis ?See above ? ?5. Sickle cell trait ([place]) ?Stable ?UC today ? ?6. Rubella non-immune status, antepartum ?Vaccine PP ? ?7. History of gestational hypertension ?BP stable ?Continue with qd BASA ? ?Preterm labor symptoms and general obstetric precautions including but not limited to vaginal bleeding, contractions, leaking of fluid and fetal movement were reviewed in detail with the patient. ?Please refer to After Visit Summary for other counseling recommendations.  ?Return in about 2 weeks (around 04/18/2021) for OB visit, face to face, MD only. ? ? ?Chancy Milroy, MD ?

## 2021-04-04 NOTE — Addendum Note (Signed)
Addended by: Louisa Second E on: 04/04/2021 10:34 AM ? ? Modules accepted: Orders ? ?

## 2021-04-04 NOTE — Progress Notes (Signed)
Met with patient while in the office as was informed she has breast feeding questions.  ? ?She did not breast feed her first child,  she would like to breast feed this infant.  ? ?Patient asked about the best breast pump to get, reviewed she will need to consult with her insurance on what is available and I will send ordering information via Mychart today.  ? ?General breast feeding questions answered.  ? ?Reviewed availability of Lactation IP/OP services. Recommended patient take BF class through [facility] or [email or web address] ? ?Patient aware she can call or send Mychart message with any questions or concerns.  ?

## 2021-04-05 LAB — CBC
Hematocrit: 33.4 % — ABNORMAL LOW (ref 34.0–46.6)
Hemoglobin: 10.7 g/dL — ABNORMAL LOW (ref 11.1–15.9)
MCH: 22.3 pg — ABNORMAL LOW (ref 26.6–33.0)
MCHC: 32 g/dL (ref 31.5–35.7)
MCV: 70 fL — ABNORMAL LOW (ref 79–97)
Platelets: 173 10*3/uL (ref 150–450)
RBC: 4.79 x10E6/uL (ref 3.77–5.28)
RDW: 16 % — ABNORMAL HIGH (ref 11.7–15.4)
WBC: 8.3 10*3/uL (ref 3.4–10.8)

## 2021-04-05 LAB — GLUCOSE TOLERANCE, 2 HOURS W/ 1HR
Glucose, 1 hour: 109 mg/dL (ref 70–179)
Glucose, 2 hour: 110 mg/dL (ref 70–152)
Glucose, Fasting: 82 mg/dL (ref 70–91)

## 2021-04-05 LAB — RPR: RPR Ser Ql: NONREACTIVE

## 2021-04-05 LAB — HIV ANTIBODY (ROUTINE TESTING W REFLEX): HIV Screen 4th Generation wRfx: NONREACTIVE

## 2021-04-05 NOTE — BH Specialist Note (Unsigned)
Integrated Behavioral Health via Telemedicine Visit  04/05/2021 Anita Hayes [id number]  Number of [facility] Clinician visits: No data recorded Session Start time: No data recorded  Session End time: No data recorded Total time in minutes: No data recorded  Referring Provider: *** Patient/Family location: *** [facility] Provider location: *** All persons participating in visit: *** Types of Service: {CHL AMB TYPE OF [phone]}  I connected with Anita Hayes and/or Anita Hayes's {family members:20773} via  Telephone or Geologist, engineering  (Video is Caregility application) and verified that I am speaking with the correct person using two identifiers. Discussed confidentiality: {YES/NO:21197}  I discussed the limitations of telemedicine and the availability of in person appointments.  Discussed there is a possibility of technology failure and discussed alternative modes of communication if that failure occurs.  I discussed that engaging in this telemedicine visit, they consent to the provision of behavioral healthcare and the services will be billed under their insurance.  Patient and/or legal guardian expressed understanding and consented to Telemedicine visit: {YES/NO:21197}  Presenting Concerns: Patient and/or family reports the following symptoms/concerns: *** Duration of problem: ***; Severity of problem: {Mild/Moderate/Severe:20260}  Patient and/or Family's Strengths/Protective Factors: {CHL AMB BH PROTECTIVE FACTORS:[phone]}  Goals Addressed: Patient will:  Reduce symptoms of: {IBH Symptoms:21014056}   Increase knowledge and/or ability of: {IBH Patient Tools:21014057}   Demonstrate ability to: {IBH Goals:21014053}  Progress towards Goals: {CHL AMB BH PROGRESS TOWARDS GOALS:[id number]}  Interventions: Interventions utilized:  {IBH Interventions:21014054} Standardized Assessments completed: {IBH Screening  Tools:21014051}  Patient and/or Family Response: ***  Assessment: Patient currently experiencing ***.   Patient may benefit from ***.  Plan: Follow up with behavioral health clinician on : *** Behavioral recommendations: *** Referral(s): {IBH Referrals:21014055}  I discussed the assessment and treatment plan with the patient and/or parent/guardian. They were provided an opportunity to ask questions and all were answered. They agreed with the plan and demonstrated an understanding of the instructions.   They were advised to call back or seek an in-person evaluation if the symptoms worsen or if the condition fails to improve as anticipated.  Caroleen Hamman Danaye Sobh, LCSW

## 2021-04-06 ENCOUNTER — Encounter: Payer: Self-pay | Admitting: Family Medicine

## 2021-04-06 LAB — CULTURE, OB URINE

## 2021-04-06 LAB — URINE CULTURE, OB REFLEX

## 2021-04-08 ENCOUNTER — Telehealth: Payer: Self-pay | Admitting: *Deleted

## 2021-04-08 DIAGNOSIS — O99019 Anemia complicating pregnancy, unspecified trimester: Secondary | ICD-10-CM

## 2021-04-08 MED ORDER — FERROUS SULFATE 324 (65 FE) MG PO TBEC: 1.0000 | DELAYED_RELEASE_TABLET | ORAL | 0 refills | Status: DC

## 2021-04-08 NOTE — Telephone Encounter (Signed)
I called Anita Hayes as requested by Dr. Rip Harbour and reached her voicemail; I left a message I am calling from her doctors office and since I did not reach her; will send a detailed mychart message. Per chart review was prescribed iron previously in December but not taking because not covered by her insurance. New rx sent in. ?Staci Acosta ?

## 2021-04-08 NOTE — Telephone Encounter (Signed)
-----   Message from Chancy Milroy, MD sent at 04/08/2021  1:44 AM EST ----- ?Please sen d in Rx for iron supplement to take every other day. ?Pt is aware. ? ?Thanks ?Legrand Como  ?

## 2021-04-11 ENCOUNTER — Ambulatory Visit: Payer: Medicaid Other | Admitting: *Deleted

## 2021-04-11 ENCOUNTER — Other Ambulatory Visit: Payer: Self-pay

## 2021-04-11 ENCOUNTER — Ambulatory Visit: Payer: Medicaid Other | Attending: Obstetrics

## 2021-04-11 ENCOUNTER — Other Ambulatory Visit: Payer: Self-pay | Admitting: Obstetrics

## 2021-04-11 ENCOUNTER — Ambulatory Visit (HOSPITAL_BASED_OUTPATIENT_CLINIC_OR_DEPARTMENT_OTHER): Payer: Medicaid Other | Admitting: *Deleted

## 2021-04-11 VITALS — BP 114/61 | HR 95

## 2021-04-11 DIAGNOSIS — O99213 Obesity complicating pregnancy, third trimester: Secondary | ICD-10-CM | POA: Diagnosis not present

## 2021-04-11 DIAGNOSIS — O09299 Supervision of pregnancy with other poor reproductive or obstetric history, unspecified trimester: Secondary | ICD-10-CM | POA: Diagnosis not present

## 2021-04-11 DIAGNOSIS — Z8759 Personal history of other complications of pregnancy, childbirth and the puerperium: Secondary | ICD-10-CM | POA: Insufficient documentation

## 2021-04-11 DIAGNOSIS — O0933 Supervision of pregnancy with insufficient antenatal care, third trimester: Secondary | ICD-10-CM | POA: Diagnosis not present

## 2021-04-11 DIAGNOSIS — Z148 Genetic carrier of other disease: Secondary | ICD-10-CM | POA: Diagnosis not present

## 2021-04-11 DIAGNOSIS — Z3A30 30 weeks gestation of pregnancy: Secondary | ICD-10-CM

## 2021-04-11 DIAGNOSIS — Z362 Encounter for other antenatal screening follow-up: Secondary | ICD-10-CM | POA: Diagnosis not present

## 2021-04-11 DIAGNOSIS — O0932 Supervision of pregnancy with insufficient antenatal care, second trimester: Secondary | ICD-10-CM

## 2021-04-11 DIAGNOSIS — D563 Thalassemia minor: Secondary | ICD-10-CM

## 2021-04-11 DIAGNOSIS — O0990 Supervision of high risk pregnancy, unspecified, unspecified trimester: Secondary | ICD-10-CM | POA: Diagnosis present

## 2021-04-11 DIAGNOSIS — O99212 Obesity complicating pregnancy, second trimester: Secondary | ICD-10-CM

## 2021-04-11 DIAGNOSIS — O365920 Maternal care for other known or suspected poor fetal growth, second trimester, not applicable or unspecified: Secondary | ICD-10-CM | POA: Diagnosis present

## 2021-04-11 DIAGNOSIS — O365930 Maternal care for other known or suspected poor fetal growth, third trimester, not applicable or unspecified: Secondary | ICD-10-CM

## 2021-04-11 DIAGNOSIS — O285 Abnormal chromosomal and genetic finding on antenatal screening of mother: Secondary | ICD-10-CM | POA: Diagnosis not present

## 2021-04-11 NOTE — Procedures (Signed)
Anita Hayes ?2001-01-24 ?[redacted]w[redacted]d ? ?Fetus A Non-Stress Test Interpretation for 04/11/21 ? ?Indication: IUGR ? ?Fetal Heart Rate A ?Mode: External ?Baseline Rate (A): 150 bpm ?Variability: Moderate ?Accelerations: 10 x 10 ?Decelerations: None ?Multiple birth?: No ? ?Uterine Activity ?Mode: Palpation, Toco ?Contraction Frequency (min): 2 uc's with ui ?Contraction Duration (sec): 40-60 ?Contraction Quality: Mild ?Resting Tone Palpated: Relaxed ?Resting Time: Adequate ? ?Interpretation (Fetal Testing) ?Nonstress Test Interpretation: Reactive ?Overall Impression: Reassuring for gestational age ?Comments: Dr. Judeth Cornfield reviewed tracing ? ? ?

## 2021-04-12 ENCOUNTER — Other Ambulatory Visit: Payer: Self-pay | Admitting: *Deleted

## 2021-04-12 DIAGNOSIS — O365930 Maternal care for other known or suspected poor fetal growth, third trimester, not applicable or unspecified: Secondary | ICD-10-CM

## 2021-04-16 ENCOUNTER — Ambulatory Visit: Payer: Medicaid Other | Admitting: Clinical

## 2021-04-16 DIAGNOSIS — Z7189 Other specified counseling: Secondary | ICD-10-CM

## 2021-04-16 DIAGNOSIS — Z658 Other specified problems related to psychosocial circumstances: Secondary | ICD-10-CM

## 2021-04-16 NOTE — Patient Instructions (Signed)
Center for [facility] ?[address] ?[place], [place] [address] ?[phone] (main office) ?[phone] (Owais Pruett's office) ? ? ?[place] [employer]  ?(Childcare options, Early childcare development, etc.) ?www.[employer].tn ? ? ? ? ?BRAINSTORMING ? ?Develop a Plan ?Goals: ?Provide a way to start conversation about your new life with a baby ?Assist parents in recognizing and using resources within their reach ?Help pave the way before birth for an easier period of transition afterwards. ? ?Make a list of the following information to keep in a central location: ?Full name of Mom and Partner: _____________________________________________ ?Baby's full name and Date of Birth: ___________________________________________ ?Home Address: ___________________________________________________________ ?________________________________________________________________________ ?Home Phone: ____________________________________________________________ ?Parents' cell numbers: _____________________________________________________ ?________________________________________________________________________ ?Name and contact info for OB: ______________________________________________ ?Name and contact info for Pediatrician:________________________________________ ?Contact info for Lactation Consultants: ________________________________________ ? ?REST and SLEEP ?*You each need at least 4-5 hours of uninterrupted sleep every day. Write specific names and contact information.* ?How are you going to rest in the postpartum period? While partner's home? When partner returns to work? When you both return to work? ?Where will your baby sleep? ?Who is available to help during the day? Evening? Night? ?Who could move in for a period to help support you? ?What are some ideas to help you get enough  sleep? ?__________________________________________________________________________________________________________________________________________________________________________________________________________________________________________ ?NUTRITIOUS FOOD AND DRINK ?*Plan for meals before your baby is born so you can have healthy food to eat during the immediate postpartum period.* ?Who will look after breakfast? Lunch? Dinner? List names and contact information. Brainstorm quick, healthy ideas for each meal. ?What can you do before baby is born to prepare meals for the postpartum period? ?How can others help you with meals? ?Which grocery stores provide online shopping and delivery? ?Which restaurants offer take-out or delivery options? ?______________________________________________________________________________________________________________________________________________________________________________________________________________________________________________________________________________________________________________________________________________________________________________________________________ ? ?CARE FOR MOM ?*It's important that mom is cared for and pampered in the postpartum period. Remember, the most important ways new mothers need care are: sleep, nutrition, gentle exercise, and time off.* ?Who can come take care of mom during this period? Make a list of people with their contact information. ?List some activities that make you feel cared for, rested, and energized? Who can make sure you have opportunities to do these things? ?Does mom have a space of her very own within your home that's just for her? Make a ?[facility]? where she can be comfortable, rest, and renew herself  daily. ?______________________________________________________________________________________________________________________________________________________________________________________________________________________________________________________________________________________________________________________________________________________________________________________________________ ? ? ? ?CARE FOR AND FEEDING BABY ?*Knowledgeable and encouraging people will offer the best support with regard to feeding your baby.* ?Educate yourself and choose the best feeding option for your baby. ?Make a list of people who will guide, support, and be a resource for you as your care for and feed your baby. (Friends that have breastfed or are currently breastfeeding, lactation consultants, breastfeeding support groups, etc.) ?Consider a postpartum doula. (These websites can give you information: dona.org & https://shea.org/) ?Seek out local breastfeeding resources like the breastfeeding support group at [employer] or [employer]. ?______________________________________________________________________________________________________________________________________________________________________________________________________________________________________________________________________________________________________________________________________________________________________________________________________ ? ?CHORES AND ERRANDS ?Who can help with a thorough cleaning before baby is born? ?Make a list of people who will help with housekeeping and chores, like laundry, light cleaning, dishes, bathrooms, etc. ?Who can run some errands for you? ?What can you do to make sure you are stocked with basic supplies before baby is born? ?Who is going to do the  shopping? ?______________________________________________________________________________________________________________________________________________________________________________________________________________________________________________________________________________________________________________________________________________________________________________________________________ ? ? ? ? ?Family Adjustment ?*Nurture yourselves?it helps parents be more loving and allows for better  bonding with their child.* ?What sorts of things do you and partner enjoy doing together? Which activities help you to connect and strengthen your relationship? Make a list of those things. Make a list of people whom you trust to care for your baby so you can have some time together as a couple. ?What types of things help partner feel connected to Mom? Make a list. ?What needs will partner have in order to bond with baby? ?Other children? Who will care for them when you go into labor and while you are in the hospital? ?Think about what the needs of your older children might be. Who can help you meet those needs? In what ways are you helping them prepare for bringing baby home? List some specific strategies you have for family adjustment. ?_______________________________________________________________________________________________________________________________________________________________________________________________________________________________________________________________________________________________________________________________________________ ? ?SUPPORT ?*Someone who can empathize with experiences normalizes your problems and makes them more bearable.* ?Make a list of other friends, neighbors, and/or co-workers you know with infants (and small children, if applicable) with whom you can connect. ?Make a list of local or online support groups, mom groups, etc. in which you can be  involved. ?______________________________________________________________________________________________________________________________________________________________________________________________________________________________________________________________________________________________________________________________________________________________________________________________________ ? ?Childcare Plans ?Investigate and plan for childcare if mom is returning to work. ?Talk about mom's concerns about her transition back to work. ?Talk about partner's concerns regarding this transition. ? ?Mental Health ?*Your mental health is one of the highest priorities for a pregnant or postp

## 2021-04-18 ENCOUNTER — Encounter: Payer: Self-pay | Admitting: Obstetrics & Gynecology

## 2021-04-18 ENCOUNTER — Other Ambulatory Visit: Payer: Self-pay

## 2021-04-18 ENCOUNTER — Ambulatory Visit (INDEPENDENT_AMBULATORY_CARE_PROVIDER_SITE_OTHER): Payer: Medicaid Other | Admitting: Obstetrics & Gynecology

## 2021-04-18 VITALS — BP 114/58 | HR 91 | Wt 228.7 lb

## 2021-04-18 DIAGNOSIS — Q256 Stenosis of pulmonary artery: Secondary | ICD-10-CM

## 2021-04-18 DIAGNOSIS — Z8759 Personal history of other complications of pregnancy, childbirth and the puerperium: Secondary | ICD-10-CM

## 2021-04-18 DIAGNOSIS — Q249 Congenital malformation of heart, unspecified: Secondary | ICD-10-CM

## 2021-04-18 DIAGNOSIS — O99210 Obesity complicating pregnancy, unspecified trimester: Secondary | ICD-10-CM

## 2021-04-18 DIAGNOSIS — Z3A31 31 weeks gestation of pregnancy: Secondary | ICD-10-CM

## 2021-04-18 DIAGNOSIS — O365930 Maternal care for other known or suspected poor fetal growth, third trimester, not applicable or unspecified: Secondary | ICD-10-CM

## 2021-04-18 DIAGNOSIS — O0990 Supervision of high risk pregnancy, unspecified, unspecified trimester: Secondary | ICD-10-CM

## 2021-04-18 NOTE — Patient Instructions (Signed)
Return to office for any scheduled appointments. Call the office or go to the MAU at [facility] if:  You begin to have strong, frequent contractions  Your water breaks.  Sometimes it is a big gush of fluid, sometimes it is just a trickle that keeps getting your panties wet or running down your legs  You have vaginal bleeding.  It is normal to have a small amount of spotting if your cervix was checked.   You do not feel your baby moving like normal.  If you do not, get something to eat and drink and lay down and focus on feeling your baby move.   If your baby is still not moving like normal, you should call the office or go to MAU.  Any other obstetric concerns.   

## 2021-04-18 NOTE — Progress Notes (Signed)
? ?PRENATAL VISIT NOTE ? ?Subjective:  ?Anita Hayes is a 21 y.o. G2P1001 at [redacted]w[redacted]d being seen today for ongoing prenatal care.  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 ? ?Patient reports no complaints.  Contractions: Not present. Vag. Bleeding: None.  Movement: Present. Denies leaking of fluid.  ? ?The following portions of the patient's history were reviewed and updated as appropriate: allergies, current medications, past family history, past medical history, past social history, past surgical history and problem list.  ? ?Objective:  ? ?Vitals:  ? 04/18/21 1623 04/18/21 1626  ?BP: (!) 123/48 (!) 114/58  ?Pulse: (!) 53 91  ?Weight: 228 lb 11.2 oz (103.7 kg)   ? ? ?Fetal Status: Fetal Heart Rate (bpm): 141   Movement: Present    ? ?General:  Alert, oriented and cooperative. Patient is in no acute distress.  ?Skin: Skin is warm and dry. No rash noted.   ?Cardiovascular: Normal heart rate noted  ?Respiratory: Normal respiratory effort, no problems with respiration noted  ?Abdomen: Soft, gravid, appropriate for gestational age.  Pain/Pressure: Absent     ?Pelvic: Cervical exam deferred        ?Extremities: Normal range of motion.  Edema: None  ?Mental Status: Normal mood and affect. Normal behavior. Normal judgment and thought content.  ?[place] MFM OB FOLLOW UP ? ?Result Date: 04/11/2021 ?----------------------------------------------------------------------  OBSTETRICS REPORT                       (Signed Final  04/11/2021 04:12 pm) ---------------------------------------------------------------------- Patient Info  ID #:       [id number]                          D.O.B.:  Jun 28, 2000 (21 yrs)  Name:       Anita Hayes                   Visit Date: 04/11/2021 02:17 pm ---------------------------------------------------------------------- Performed By  Attending:        Tama High MD        Ref. Address:     [address]                                                             [place], [place]  Performed By:     Rodrigo Ran BS      [facility]                                                             [place] [facility]                                                             [facility]  Referred By:      [facility] ---------------------------------------------------------------------- Orders  #  Description                           Code        Ordered By  1  [place] MFM OB FOLLOW UP                   [phone]    Peterson Ao  2  [place] MFM UA CORD DOPPLER                [id number]    YU FANG ----------------------------------------------------------------------  #  Order #                     Accession #                Episode #  1  [id number]                   [id number]                 [id number]  2  [id number]                   [id number]                 [id number] ---------------------------------------------------------------------- Indications  Maternal care for known or suspected poor      O36.5930  fetal growth, third trimester, not applicable or  unspecified IUGR  Late to prenatal care, third trimester         [id number]  Obesity complicating pregnancy, third          O99.213  trimester (pregravid BMI 40)  Poor obstetric history: Previous               O09.299  preeclampsia / eclampsia/gestational HTN  Encounter for other antenatal screening         Z36.2  follow-up  Genetic carrier (Alpha Thal & Sickle Cell      Z14.8  Disease)  Low Risk NIPS  Neg AFP  [redacted] weeks gestation of pregnancy                Z3A.30 ---------------------------------------------------------------------- Fetal Evaluation  Num Of Fetuses:         1  Fetal Heart Rate(bpm):  153  Cardiac Activity:  Observed  Presentation:           Cephalic  Placenta:               Posterior  P. Cord Insertion:      Previously Visualized  Amniotic Fluid  AFI FV:      Within normal limits  AFI Sum(cm)     %Tile       Largest Pocket(cm)  12.2            31          5.2  RUQ(cm)                     LUQ(cm)        LLQ(cm)  2.9                         5.2            4.1 ---------------------------------------------------------------------- Biometry  BPD:      72.3  mm     G. Age:  29w 0d         13  %    CI:        74.24   %    70 - 86                                                          FL/HC:      19.1   %    19.2 - 21.4  HC:      266.4  mm     G. Age:  29w 0d        3.6  %    HC/AC:      1.11        0.99 - 1.21  AC:      239.9  mm     G. Age:  28w 2d          6  %    FL/BPD:     70.3   %    71 - 87  FL:       50.8  mm     G. Age:  27w 2d        < 1  %    FL/AC:      21.2   %    20 - 24  HUM:      46.5  mm     G. Age:  27w 3d        < 5  %  LV:        2.3  mm  Est. FW:    1166  gm      2 lb 9 oz    2.3  % ---------------------------------------------------------------------- OB History  Gravidity:    2         Term:   1        Prem:   0        SAB:   0  TOP:          0       Ectopic:  0        Living: 1 ---------------------------------------------------------------------- Gestational Age  LMP:           37w 0d  Date:  09/13/20                 EDD:   06/20/21  U/S Today:     28w 3d                                        EDD:   07/01/21  Best:          30w 0d     Det. By:  LMP  (09/13/20)          EDD:   06/20/21 ---------------------------------------------------------------------- Anatomy   Cranium:               Appears normal         Aortic Arch:            Previously seen  Cavum:                 Appears normal         Ductal Arch:            Previously seen  Ventricles:            Appears normal         Diaphragm:              Previously seen  Choroid Plexus:        Previously seen        Stomach:                Appears normal, left                                                                        sided  Cerebellum:            Appears normal         Abdomen:                Previously seen  Posterior Fossa:       Appears normal         Abdominal Wall:         Previously seen  Nuchal Fold:           Not applicable ([id number]    Cord Vessels:           Previously seen                         wks GA)  Face:                  Appears normal         Kidneys:                Appear normal                         (orbits and profile)  Lips:                  Appears normal         Bladder:                Appears normal  Thoracic:              Previously seen        Spine:                  Previously seen  Heart:                 Previously seen        Upper Extremities:      Previously seen  RVOT:                  Appears normal         Lower Extremities:      Previously seen  LVOT:                  Appears normal  Other:  Female gender previously seen. VC, 3VV and 3VTV previously          visualized. Nasal bone seen. ---------------------------------------------------------------------- Doppler - Fetal Vessels  Umbilical Artery   S/D     %tile      RI    %tile      PI    %tile            ADFV    RDFV   2.31       19    0.57       19     0.8       18               No      No ---------------------------------------------------------------------- Cervix Uterus Adnexa  Cervix  Not visualized (advanced GA >24wks)  Uterus  No abnormality visualized.  Right Ovary  Within normal limits.  Left Ovary  Not visualized.  Cul De Sac  No free fluid seen.  Adnexa  No abnormality visualized.  ---------------------------------------------------------------------- Impression  Patient returned for fetal growth assessment.  On previous  growth assessment, the estimated fetal weight was at the  14th percentile.  On today's ultrasound,

## 2021-04-19 ENCOUNTER — Telehealth: Payer: Self-pay

## 2021-04-19 ENCOUNTER — Ambulatory Visit: Payer: Medicaid Other | Attending: Obstetrics and Gynecology

## 2021-04-19 ENCOUNTER — Ambulatory Visit: Payer: Medicaid Other | Admitting: *Deleted

## 2021-04-19 ENCOUNTER — Ambulatory Visit (HOSPITAL_BASED_OUTPATIENT_CLINIC_OR_DEPARTMENT_OTHER): Payer: Medicaid Other | Admitting: *Deleted

## 2021-04-19 ENCOUNTER — Other Ambulatory Visit: Payer: Self-pay | Admitting: Obstetrics and Gynecology

## 2021-04-19 VITALS — BP 106/49 | HR 88

## 2021-04-19 DIAGNOSIS — O99213 Obesity complicating pregnancy, third trimester: Secondary | ICD-10-CM

## 2021-04-19 DIAGNOSIS — Z148 Genetic carrier of other disease: Secondary | ICD-10-CM | POA: Diagnosis not present

## 2021-04-19 DIAGNOSIS — O365930 Maternal care for other known or suspected poor fetal growth, third trimester, not applicable or unspecified: Secondary | ICD-10-CM

## 2021-04-19 DIAGNOSIS — D563 Thalassemia minor: Secondary | ICD-10-CM

## 2021-04-19 DIAGNOSIS — O365931 Maternal care for other known or suspected poor fetal growth, third trimester, fetus 1: Secondary | ICD-10-CM | POA: Diagnosis not present

## 2021-04-19 DIAGNOSIS — O0990 Supervision of high risk pregnancy, unspecified, unspecified trimester: Secondary | ICD-10-CM | POA: Diagnosis present

## 2021-04-19 DIAGNOSIS — Z362 Encounter for other antenatal screening follow-up: Secondary | ICD-10-CM | POA: Diagnosis not present

## 2021-04-19 DIAGNOSIS — O0933 Supervision of pregnancy with insufficient antenatal care, third trimester: Secondary | ICD-10-CM | POA: Diagnosis not present

## 2021-04-19 DIAGNOSIS — Z3A31 31 weeks gestation of pregnancy: Secondary | ICD-10-CM | POA: Diagnosis not present

## 2021-04-19 NOTE — Telephone Encounter (Signed)
Called Pt to advise of Fetal Echo appt on 05/24/21 @ 10:30 & she said that she already knows. ?

## 2021-04-19 NOTE — Progress Notes (Signed)
Non reactive, reassuring NST.  Dr. Judeth Cornfield will bring pt back on Monday for repeat NST. ?

## 2021-04-19 NOTE — Procedures (Signed)
Anita Hayes ?May 23, 2000 ?[redacted]w[redacted]d ? ?Fetus A Non-Stress Test Interpretation for 04/19/21 ? ?Indication: IUGR ? ?Fetal Heart Rate A ?Mode: External ?Baseline Rate (A): 150 bpm ?Variability: Minimal, Moderate ?Accelerations: 10 x 10 (one 10x10) ?Decelerations: None ? ?Uterine Activity ?Mode: Toco ?Contraction Frequency (min): none ?Resting Tone Palpated: Relaxed ? ?Interpretation (Fetal Testing) ?Nonstress Test Interpretation: Non-reactive ?Overall Impression: Reassuring for gestational age ?Comments: tracing reviewed by Dr. Judeth Cornfield ? ? ?

## 2021-04-22 ENCOUNTER — Ambulatory Visit: Payer: Medicaid Other

## 2021-04-25 ENCOUNTER — Telehealth: Payer: Self-pay

## 2021-04-25 NOTE — Telephone Encounter (Signed)
Patient called wanting to reschedule her Friday 04/26/21 appointment to next Tuesday 04/30/21 - no ultrasound appointments available next Tuesday - spoke with Dr. Judeth Cornfield - patient ok to miss this weeks BPP, UA Dopplers & NST - will need to be here next Friday 05/03/21 for Follow up ultrasound, BPP, UA Doppler and NST ?

## 2021-04-26 ENCOUNTER — Ambulatory Visit: Payer: Medicaid Other

## 2021-05-02 ENCOUNTER — Other Ambulatory Visit (HOSPITAL_COMMUNITY)
Admission: RE | Admit: 2021-05-02 | Discharge: 2021-05-02 | Disposition: A | Discharge status: Home / Self Care | Payer: Medicaid Other | Source: Ambulatory Visit | Attending: Family Medicine | Admitting: Family Medicine

## 2021-05-02 ENCOUNTER — Ambulatory Visit (INDEPENDENT_AMBULATORY_CARE_PROVIDER_SITE_OTHER): Payer: Medicaid Other | Admitting: Family Medicine

## 2021-05-02 VITALS — BP 131/72 | HR 73 | Wt 231.1 lb

## 2021-05-02 DIAGNOSIS — Z113 Encounter for screening for infections with a predominantly sexual mode of transmission: Secondary | ICD-10-CM | POA: Diagnosis present

## 2021-05-02 DIAGNOSIS — O09899 Supervision of other high risk pregnancies, unspecified trimester: Secondary | ICD-10-CM

## 2021-05-02 DIAGNOSIS — O0990 Supervision of high risk pregnancy, unspecified, unspecified trimester: Secondary | ICD-10-CM | POA: Diagnosis not present

## 2021-05-02 DIAGNOSIS — Q256 Stenosis of pulmonary artery: Secondary | ICD-10-CM

## 2021-05-02 DIAGNOSIS — Z2839 Other underimmunization status: Secondary | ICD-10-CM

## 2021-05-02 DIAGNOSIS — O365930 Maternal care for other known or suspected poor fetal growth, third trimester, not applicable or unspecified: Secondary | ICD-10-CM

## 2021-05-02 NOTE — Patient Instructions (Signed)

## 2021-05-02 NOTE — Progress Notes (Signed)
? ?  PRENATAL VISIT NOTE ? ?Subjective:  ?Anita Hayes is a 21 y.o. G2P1001 at [redacted]w[redacted]d being seen today for ongoing prenatal care.  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 ? ?Patient reports no complaints.  Contractions: Not present. Vag. Bleeding: None.  Movement: Present. Denies leaking of fluid.  ? ?The following portions of the patient's history were reviewed and updated as appropriate: allergies, current medications, past family history, past medical history, past social history, past surgical history and problem list.  ? ?Objective:  ? ?Vitals:  ? 05/02/21 1342  ?BP: 131/72  ?Pulse: 73  ?Weight: 231 lb 1.6 oz (104.8 kg)  ? ? ?Fetal Status: Fetal Heart Rate (bpm): 152 Fundal Height: 32 cm Movement: Present    ? ?General:  Alert, oriented and cooperative. Patient is in no acute distress.  ?Skin: Skin is warm and dry. No rash noted.   ?Cardiovascular: Normal heart rate noted  ?Respiratory: Normal respiratory effort, no problems with respiration noted  ?Abdomen: Soft, gravid, appropriate for gestational age.  Pain/Pressure: Present     ?Pelvic: Cervical exam deferred        ?Extremities: Normal range of motion.  Edema: None  ?Mental Status: Normal mood and affect. Normal behavior. Normal judgment and thought content.  ? ?Assessment and Plan:  ?Pregnancy: G2P1001 at [redacted]w[redacted]d ?1. Pulmonary artery stenosis ?Sees cardio OB tomorrow ?ECHO for fetus in May ? ?2. Rubella non-immune status, antepartum ?MMR pp ? ?3. Supervision of high risk pregnancy, antepartum ? ? ?4.  Poor fetal growth affecting management of mother in third trimester, single or unspecified fetus ?U/s for growth and is in testing ? ?5. Screening examination for STD (sexually transmitted disease) ?- Cervicovaginal ancillary only( [place]) ? ?Preterm labor symptoms and general obstetric precautions including but not limited to vaginal bleeding, contractions, leaking of fluid and fetal movement were reviewed in detail with the patient. ?Please refer to After Visit Summary for other counseling recommendations.  ? ?Return in 2 weeks (on 05/16/2021). ? ?Future Appointments  ?Date Time Provider [place]  ?05/02/2021  2:35 PM Donnamae Jude, MD [facility]  ?05/03/2021  1:45 PM [facility] NURSE [facility]  ?05/03/2021  2:00 PM [facility] US1 [facility]  ?05/03/2021  2:15 PM [facility] NST [facility]  ?05/03/2021  3:20 PM Tobb, Godfrey Pick, DO [facility] None  ?05/09/2021  2:15 PM [facility] NURSE [facility]  ?05/09/2021  2:30 PM [facility] US3 [facility]  ?05/09/2021  3:15 PM [facility] NST [facility]  ? ? ?Donnamae Jude, MD ? ?

## 2021-05-03 ENCOUNTER — Ambulatory Visit: Payer: Medicaid Other | Admitting: *Deleted

## 2021-05-03 ENCOUNTER — Telehealth: Payer: Self-pay | Admitting: Licensed Clinical Social Worker

## 2021-05-03 ENCOUNTER — Other Ambulatory Visit: Payer: Self-pay | Admitting: *Deleted

## 2021-05-03 ENCOUNTER — Encounter: Payer: Self-pay | Admitting: Cardiology

## 2021-05-03 ENCOUNTER — Ambulatory Visit (INDEPENDENT_AMBULATORY_CARE_PROVIDER_SITE_OTHER): Payer: Medicaid Other | Admitting: Cardiology

## 2021-05-03 ENCOUNTER — Ambulatory Visit: Payer: Medicaid Other | Attending: Obstetrics and Gynecology

## 2021-05-03 VITALS — BP 125/62 | HR 88

## 2021-05-03 VITALS — BP 126/58 | HR 97 | Ht 63.0 in | Wt 232.3 lb

## 2021-05-03 DIAGNOSIS — O0933 Supervision of pregnancy with insufficient antenatal care, third trimester: Secondary | ICD-10-CM

## 2021-05-03 DIAGNOSIS — O99210 Obesity complicating pregnancy, unspecified trimester: Secondary | ICD-10-CM

## 2021-05-03 DIAGNOSIS — Z789 Other specified health status: Secondary | ICD-10-CM | POA: Diagnosis not present

## 2021-05-03 DIAGNOSIS — D563 Thalassemia minor: Secondary | ICD-10-CM | POA: Diagnosis not present

## 2021-05-03 DIAGNOSIS — O365930 Maternal care for other known or suspected poor fetal growth, third trimester, not applicable or unspecified: Secondary | ICD-10-CM

## 2021-05-03 DIAGNOSIS — Z3A33 33 weeks gestation of pregnancy: Secondary | ICD-10-CM | POA: Diagnosis not present

## 2021-05-03 DIAGNOSIS — O365931 Maternal care for other known or suspected poor fetal growth, third trimester, fetus 1: Secondary | ICD-10-CM

## 2021-05-03 DIAGNOSIS — O99213 Obesity complicating pregnancy, third trimester: Secondary | ICD-10-CM | POA: Diagnosis not present

## 2021-05-03 DIAGNOSIS — Z139 Encounter for screening, unspecified: Secondary | ICD-10-CM | POA: Insufficient documentation

## 2021-05-03 DIAGNOSIS — O0990 Supervision of high risk pregnancy, unspecified, unspecified trimester: Secondary | ICD-10-CM | POA: Insufficient documentation

## 2021-05-03 DIAGNOSIS — Z148 Genetic carrier of other disease: Secondary | ICD-10-CM | POA: Diagnosis not present

## 2021-05-03 DIAGNOSIS — O09293 Supervision of pregnancy with other poor reproductive or obstetric history, third trimester: Secondary | ICD-10-CM | POA: Diagnosis not present

## 2021-05-03 DIAGNOSIS — O285 Abnormal chromosomal and genetic finding on antenatal screening of mother: Secondary | ICD-10-CM | POA: Diagnosis not present

## 2021-05-03 DIAGNOSIS — Z362 Encounter for other antenatal screening follow-up: Secondary | ICD-10-CM | POA: Diagnosis not present

## 2021-05-03 DIAGNOSIS — Z8759 Personal history of other complications of pregnancy, childbirth and the puerperium: Secondary | ICD-10-CM

## 2021-05-03 DIAGNOSIS — E669 Obesity, unspecified: Secondary | ICD-10-CM

## 2021-05-03 HISTORY — DX: Encounter for screening, unspecified: Z13.9

## 2021-05-03 HISTORY — DX: Other specified health status: Z78.9

## 2021-05-03 LAB — CERVICOVAGINAL ANCILLARY ONLY
Chlamydia: NEGATIVE
Comment: NEGATIVE
Comment: NEGATIVE
Comment: NORMAL
Neisseria Gonorrhea: NEGATIVE
Trichomonas: NEGATIVE

## 2021-05-03 NOTE — Procedures (Signed)
Anita Hayes ?February 11, 2000 ?[redacted]w[redacted]d ? ?Fetus A Non-Stress Test Interpretation for 05/03/21 ? ?Indication: IUGR ? ?Fetal Heart Rate A ?Mode: External ?Baseline Rate (A): 135 bpm ?Variability: Minimal, Moderate ?Accelerations: 15 x 15 ?Decelerations: None ?Multiple birth?: No ? ?Uterine Activity ?Mode: Palpation, Toco ?Contraction Frequency (min): none ?Resting Tone Palpated: Relaxed ? ?Interpretation (Fetal Testing) ?Nonstress Test Interpretation: Reactive ?Overall Impression: Reassuring for gestational age ?Comments: Dr. Judeth Cornfield reviewed tracing ? ? ?

## 2021-05-03 NOTE — Patient Instructions (Addendum)
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
## 2021-05-03 NOTE — Progress Notes (Signed)
?[name] ? ?New Evaluation ? ?Date:  05/03/2021  ? ?ID:  [name], DOB 2000/05/27, MRN [id number] ? ?PCP:  [name], DO ?  ?[facility] Providers ?Cardiologist:  [name], DO  ?Electrophysiologist:  None      ? ?Referring MD: [name], DO  ? ?Chief Complaint: " I am ok"  ? ?History of Present Illness:   ? ?Anita Hayes is a 21 y.o. female [G2P1001] who is being seen today for the evaluation of history of preeclampsia at the request of [name], [place], DO.  ? ?Medical history includes chronic hypertension pregnancy complicated by preeclampsia with her last pregnancy which was about a year ago, morbid obesity here today to be evaluated in the cardiac surgery clinic. ? ?She has no complaints today.  But had questions about the complications of preeclampsia as well as the hypertension.  She does have a blood pressure at home and been taking her blood pressure.  She was able to share this with me which her blood pressure appears to be normal. ? ?Talking to the patient she also expressed to me that she is experiencing housing issues she tells me that there are a lot of bad things going on where she lives but she would not give me any details.  She has a son who is about a-year-old and is currently pregnant.  She notes that she used to have a job but because she contracted COVID in May several days and was not able to work.  She worked up until this pregnancy. ? ? ?Prior CV Studies Reviewed: ?The following studies were reviewed today: ? ?TTE 11/16/2019 IMPRESSIONS  ? 1. Left ventricular ejection fraction, by estimation, is 60 to 65%. The left ventricle has normal function. The left ventricle has no regional  ?wall motion abnormalities. Left ventricular diastolic parameters were normal.  ? 2. Right ventricular systolic function is normal. The right ventricular size is normal.  ? 3. The mitral valve is normal in structure. No evidence of mitral valve regurgitation. No evidence of mitral  stenosis.  ? 4. The aortic valve is normal in structure. Aortic valve regurgitation is not visualized.  ? 5. The pulmonic valve, pulmonary truck and the proximal right and left pulmonary arteries appear normal . I do not see any evidence of pulmonary  ?artery stenosis.  ? ?FINDINGS  ? Left Ventricle: Left ventricular ejection fraction, by estimation, is 60  ?to 65%. The left ventricle has normal function. The left ventricle has no  ?regional wall motion abnormalities. The left ventricular internal cavity  ?size was normal in size. There is  ? no left ventricular hypertrophy. Left ventricular diastolic parameters  ?were normal. The ratio of pulmonic flow to systemic flow (Qp/Qs ratio) is  ?1.80.  ? ?Right Ventricle: The right ventricular size is normal. No increase in  ?right ventricular wall thickness. Right ventricular systolic function is  ?normal.  ? ?Left Atrium: Left atrial size was normal in size.  ? ?Right Atrium: Right atrial size was normal in size.  ? ?Pericardium: There is no evidence of pericardial effusion.  ? ?Mitral Valve: The mitral valve is normal in structure. No evidence of  ?mitral valve regurgitation. No evidence of mitral valve stenosis.  ? ?Tricuspid Valve: The tricuspid valve is normal in structure. Tricuspid  ?valve regurgitation is not demonstrated. No evidence of tricuspid  ?stenosis.  ? ?Aortic Valve: The aortic valve is normal in structure. Aortic valve  ?regurgitation is not visualized.  ? ?Pulmonic Valve: The pulmonic  valve was normal in structure. Pulmonic valve  ?regurgitation is trivial. No evidence of pulmonic stenosis.  ? ?Aorta: The aortic root and ascending aorta are structurally normal, with  ?no evidence of dilitation.  ? ?Pulmonary Artery: The pulmonic valve, pulmonary truck and the proximal  ?right and left pulmonary arteries appear normal . I do not see any  ?evidence of pulmonary artery stenosis.  ? ?IAS/Shunts: The atrial septum is grossly normal. The ratio of pulmonic   ?flow to systemic flow (Qp/Qs ratio) is 1.80.  ? ?Past Medical History:  ?Diagnosis Date  ? Acne 07/16/2016  ? Gonorrhea affecting pregnancy 11/22/2019  ? _0  tell peds at delivery _1  toc late nov +11/17/19  ? History of chlamydia 11/17/2019  ? 9/14 positive, neg 10/16  ? Pregnancy   ? Pregnancy induced hypertension   ? GHTN with first preg  ? Pulmonary stenosis   ? Sickle cell trait ([place])   ? Sickle cell trait ([place])   ? 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 [facility] Pediatric Echo Transthoracic Report  INTERPRETATION SUMMARY Trivial left pulmonary artery stenosis, peak gradient = 16 mmHg.      ? ? ?Past Surgical History:  ?Procedure Laterality Date  ? ADENOIDECTOMY    ? TONSILLECTOMY AND ADENOIDECTOMY    ?   ? ?OB History   ? ? Gravida  ?2  ? Para  ?1  ? Term  ?1  ? Preterm  ?   ? AB  ?   ? Living  ?1  ?  ? ? SAB  ?   ? IAB  ?   ? Ectopic  ?   ? Multiple  ?0  ? Live Births  ?1  ?   ?  ?  ?    ? ? ?Current Medications: ?Current Meds  ?Medication Sig  ? aspirin EC 81 MG tablet Take 1 tablet (81 mg total) by mouth daily. Swallow whole.  ? Blood Pressure Monitoring (BLOOD PRESSURE KIT) DEVI 1 Device by Does not apply route as needed.  ? ferrous sulfate 325 (65 FE) MG EC tablet Take 1 tablet (325 mg total) by mouth every other day.  ? Prenatal Vit-Fe Fumarate-FA (PRENATAL VITAMIN) 27-0.8 MG TABS Take 1 tablet by mouth daily.  ?  ? ?Allergies:   Patient has no known allergies.  ? ?Social History  ? ?Socioeconomic History  ? Marital status: Single  ?  Spouse name: Not on file  ? Number of children: Not on file  ? Years of education: Not on file  ? Highest education level: Not on file  ?Occupational History  ? Not on file  ?Tobacco Use  ? Smoking status: Never  ? Smokeless tobacco: Never  ?Vaping Use  ? Vaping Use: Never used  ?Substance and Sexual Activity  ? Alcohol use: Never  ? Drug use: Never  ? Sexual activity: Not Currently  ?Other Topics Concern  ? Not on file  ?Social History  Narrative  ? ** Merged History Encounter **  ?    ? ?Social Determinants of Health  ? ?Financial Resource Strain: Not on file  ?Food Insecurity: Food Insecurity Present  ? Worried About Charity fundraiser in the Last Year: Sometimes true  ? Ran Out of Food in the Last Year: Sometimes true  ?Transportation Needs: Unmet Transportation Needs  ? Lack of Transportation (Medical): Yes  ? Lack of Transportation (Non-Medical): Yes  ?Physical Activity: Not on file  ?Stress: Not  on file  ?Social Connections: Not on file  ?  ? ? ?Family History  ?Problem Relation Age of Onset  ? Other Mother   ?     died of covid 03-25-2020  ? Other Father   ?     unknown hx  ?   ? ?ROS:   ?Please see the history of present illness.    ? ?All other systems reviewed and are negative. ? ? ?Labs/EKG Reviewed:   ? ?EKG:   ?EKG is was ordered today.  T ? ?Recent Labs: ?04/04/2021: Hemoglobin 10.7; Platelets 173  ? ?Recent Lipid Panel ?No results found for: CHOL, TRIG, HDL, CHOLHDL, LDLCALC, LDLDIRECT ? ?Physical Exam:   ? ?VS:  BP (!) 126/58 (BP Location: Right Arm, Patient Position: Sitting, Cuff Size: Large)   Pulse 97   Ht _0  (1.6 m)   Wt 232 lb 4.8 oz (105.4 kg)   LMP 09/13/2020 (Approximate)   SpO2 98%   BMI 41.15 kg/m?    ? ?Wt Readings from Last 3 Encounters:  ?05/03/21 232 lb 4.8 oz (105.4 kg)  ?05/02/21 231 lb 1.6 oz (104.8 kg)  ?04/18/21 228 lb 11.2 oz (103.7 kg)  ?  ? ?GEN:  Well nourished, well developed in no acute distress ?HEENT: Normal ?NECK: No JVD; No carotid bruits ?LYMPHATICS: No lymphadenopathy ?CARDIAC: RRR, no murmurs, rubs, gallops ?RESPIRATORY:  Clear to auscultation without rales, wheezing or rhonchi  ?ABDOMEN: Soft, non-tender, non-distended ?MUSCULOSKELETAL:  No edema; No deformity  ?SKIN: Warm and dry ?NEUROLOGIC:  Alert and oriented x 3 ?PSYCHIATRIC:  Normal affect  ? ? ?Risk Assessment/Risk Calculators:   ?  ?CARPREG II ?Risk Prediction Index Score:  1.  The patient's risk for a primary cardiac event is 5%. ?  ?   ?   ?  ? ? ?ASSESSMENT & PLAN:   ? ?History of preeclampsia ?Obesity in pregnancy ?SDOH Screening  ? ?Her blood pressure deceptively in the office today.  No changes will be made to her current regimen.  Asked the p

## 2021-05-06 ENCOUNTER — Telehealth: Payer: Self-pay | Admitting: Licensed Clinical Social Worker

## 2021-05-06 NOTE — Telephone Encounter (Signed)
LCSW received a text over the weekend from pt. She shared that she was able to reach [facility] house and they will complete a full referral with pt this Monday.  ? ?I texted pt back this morning, shared the hours that my phone is monitored (Mon-Fri 8am-5pm) and that I was pleased to hear she had gotten a response. I inquired if I could check in today after 10am. Pt responded simply "ye" ? ?I will give pt a call to f/u on full assessment.  ? ?[name], MSW, LCSW ?Clinical Social Worker II ?[place] [facility] Care Navigation  ?[phone]- work cell phone (preferred) ?[phone]- desk phone ? ?

## 2021-05-06 NOTE — Telephone Encounter (Signed)
LCSW received f/u texts from pt stating that [facility] will only allow one child per room. Pt also contacted Room at the [facility] and they have no availability. Pt then texted "How do I do a application for housing so I can go up there."  ? ?This Probation officer responded "Go up where? I'm sorry I am not following. [place] and [place] may have more family specific guidance. You can also try the [place] at [phone]. " ? ?Pt responded "Housing" then "I'm calling them now" ? ?This Probation officer remains available.  ? ?Westley Hummer, MSW, LCSW ?Clinical Social Worker II ?[place] [facility] Care Navigation  ?[phone]- work cell phone (preferred) ?[phone]- desk phone ? ?

## 2021-05-06 NOTE — Progress Notes (Signed)
?Heart and Vascular Care Navigation ? ?05/06/2021 ? ?[place] ?02-Feb-2001 ?[id number] ? ?Reason for Referral:  ?Family shelter options ?Engaged with patient by telephone for follow up visit for Heart and Vascular Care Coordination. ?                                                                                                  ?Assessment:        ?LCSW was able to reach pt today at [phone]. Re-introduced self, role, reason for call. Pt clarified that her address is [address] [place]. She confirms she lives with cousin and all bills etc are in "her name."  ? ?Pt confirmed PCP, requested to have [phone] removed from her list stating it was her old number. She does not want [facility] Aud[facility] on her contact list and again declined to put anyone on the contact list for her. I encouraged her to think of an emergency contact as especially given that she has a little one we would like to have a back up contact. Pt continues to decline.  ? ?She again confirmed she can receive mail at the listed [facility] address. I let her know that re-certification for Medicaid is happening and that she may get paperwork at that address in order to maintain coverage. Pt is not currently employed, stays at home with her son Phillips Odor (65 yo). Pt receives [employer] and utilizes the [facility]. She has engaged with LCSW at [facility] for assessment, pt remembers this but wasn't able to recount who she spoke with.  ? ?Pt states again that she called [facility] and they do not have space. She spoke briefly with someone from [facility] and they are to call her back and complete an assessment today. Pt requests I text her again the resources for additional housing options. LCSW was able to text her the contacts again for [facility] the [place] and [facility]. I will do so. At this time she had no additional questons- I encouraged her to reach  out to her PCP or [facility] providers if she cannot reach me as they have social work support embedded in their offices. Pt given my work hours.                                ? ?HRT/VAS Care Coordination   ? ? Patients Home Cardiology Office [facility]  and Pharmacist, hospital for [place] clinic  ? Outpatient Care Team Social Worker  ? Social Worker Name: Margarito Liner [place] [phone]  ? Living arrangements for the past 2 months Apartment  ? Lives with: Relatives; Minor Children  ? Patient Current Insurance Coverage Medicaid  ? Patient Has Concern With Paying Medical Bills No  ? Does Patient Have Prescription Coverage? Yes  ? Home Assistive Devices/Equipment None  ? ?  ? ? ?Social History:                                                                             ?  SDOH Screenings  ? ?Alcohol Screen: Not on file  ?Depression (PHQ2-9): Low Risk   ? PHQ-2 Score: 0  ?Financial Resource Strain: High Risk  ? Difficulty of Paying Living Expenses: Hard  ?Food Insecurity: Food Insecurity Present  ? Worried About Charity fundraiser in the Last Year: Sometimes true  ? Ran Out of Food in the Last Year: Sometimes true  ?Housing: Medium Risk  ? Last Housing Risk Score: 1  ?Physical Activity: Not on file  ?Social Connections: Not on file  ?Stress: Not on file  ?Tobacco Use: Low Risk   ? Smoking Tobacco Use: Never  ? Smokeless Tobacco Use: Never  ? Passive Exposure: Not on file  ?Transportation Needs: Unmet Transportation Needs  ? Lack of Transportation (Medical): Yes  ? Lack of Transportation (Non-Medical): Yes  ? ? ?SDOH Interventions: ?Financial Resources:  Financial Strain Interventions: Other (Comment) (referral to shelter/housing resources; referral to [place] [facility]/pt gets SNAP; watches her son and currently unemployed) ?DSS for financial assistance  ?Food Insecurity:  Food Insecurity Interventions: Information systems manager ([facility] only), Other (Comment) (pt has SNAP;  utilizes Pharmacist, hospital for [employer])  ?Housing Insecurity:  Housing Interventions: Other (Comment) (Provided housing/shelter resources)  ?Transportation:   Transportation Interventions: Payor Benefit  ? ? ?Follow-up plan:   ?LCSW texted her the 24 hour Family Services of the [place] number and the [employer]. I also provided Room at the [facility] and encouraged her to keep following up with numbers she has already contacted. Pt states understanding. She has my number for ongoing inquiries. I have contacted my colleagues at [facility] and [facility] for any additional family resources.  ? ? ? ?

## 2021-05-06 NOTE — Progress Notes (Deleted)
?  Heart and Vascular Care Navigation ? ?05/06/2021 ? ?Anita Hayes ?Sep 16, 2000 ?[id number] ? ?Reason for Referral:  ?  ?{HRT/VASTELEPHONEFACETOFACE:25379} for {HRT/VASINITIALFOLLOWUPCHOICE:25380} for Heart and Vascular Care Coordination. ?                                                                                                  ?Assessment:                                     ? ?HRT/VAS Care Coordination   ? ? Home Assistive Devices/Equipment None  ? ?  ? ? ?Social History:                                                                             ?SDOH Screenings  ? ?Alcohol Screen: Not on file  ?Depression (PHQ2-9): Low Risk   ? PHQ-2 Score: 0  ?Financial Resource Strain: High Risk  ? Difficulty of Paying Living Expenses: Hard  ?Food Insecurity: Food Insecurity Present  ? Worried About Programme researcher, broadcasting/film/video in the Last Year: Sometimes true  ? Ran Out of Food in the Last Year: Sometimes true  ?Housing: Medium Risk  ? Last Housing Risk Score: 1  ?Physical Activity: Not on file  ?Social Connections: Not on file  ?Stress: Not on file  ?Tobacco Use: Low Risk   ? Smoking Tobacco Use: Never  ? Smokeless Tobacco Use: Never  ? Passive Exposure: Not on file  ?Transportation Needs: Unmet Transportation Needs  ? Lack of Transportation (Medical): Yes  ? Lack of Transportation (Non-Medical): Yes  ? ? ?SDOH Interventions: ?Financial Resources:  Financial Strain Interventions: Other (Comment) (referral to shelter/housing resources; referral to [facility]/pt gets SNAP; watches her son and currently unemployed) ?{CHL AMB HRT/VAS CCM FINANCIAL RESOURCES INTERVENTIONS:23728}  ?Food Insecurity:  Food Insecurity Interventions: Stage manager ([facility] only), Other (Comment) (pt has SNAP; utilizes Therapist, music for [employer])  ?Housing Insecurity:  Housing Interventions: Other (Comment) (Provided housing/shelter resources)  ?Transportation:   Transportation Interventions: Payor Benefit  ? ?Health Promotion  Interventions:    ? ?Physical Inactivity {CHL AMB HRT/VAS OTHER REFERRALS:23729}  ?Smoking Cessation {CHL AMB HRT/VAS SMOKING CESSATON:23731}  ?Dietary Concerns ***  ?Health Coaching {CHL AMB HRT/VAS HEALTH COACHING:23732}  ? ?Other Care Navigation Interventions:    ? ?Inpatient/Outpatient Substance Abuse Counseling/Rehab Options ***  ?Provided Pharmacy assistance resources    ?Patient expressed Mental Health concerns {CHL AMB HRT/VAS MENTAL HEALTH CONCERN YES/NO:23730}  ?Patient Referred to: ***  ? ?Follow-up plan:   ? ? ? ? ?

## 2021-05-08 ENCOUNTER — Ambulatory Visit: Payer: Self-pay | Admitting: *Deleted

## 2021-05-08 ENCOUNTER — Telehealth: Payer: Self-pay | Admitting: Licensed Clinical Social Worker

## 2021-05-08 NOTE — Telephone Encounter (Signed)
Reminder regarding Medicaid re-certification process was mailed to pt, encouraging her to ensure that an up to date phone number, address and email is registered with DSS so that she receives re-certification paperwork.  ?  ?Tiffney Haughton, MSW, LCSW ?Clinical Social Worker II ?[place] [facility]/Vascular Care Navigation  ?[phone]- work cell phone (preferred) ?[phone]- desk phone ?

## 2021-05-08 NOTE — Telephone Encounter (Signed)
LCSW received the following text message prior to clinic hours "Is there a way I can get rentals insurance for a apartment if I do get one?"  ? ?LCSW responded to pt inquiry with the following during clinic hours this morning. ?"Good morning! We wouldn't be able to assist with monthly payments for rental insurance. We'd have to speak about specifics of what you'd need once you had found an apartment. There may also be assistance through [facility] the [place], not sure if you had called them? Just a reminder my hours for this phone are 8am-5pm Monday through Friday! ?- [name], social work Actor" ? ?Will provide further specifics if pt interested moving forward.  ? ?[name], MSW, LCSW ?Clinical Social Worker II ?[place] [facility]/Vascular Care Navigation  ?[phone]- work cell phone (preferred) ?[phone]- desk phone ? ?

## 2021-05-08 NOTE — Telephone Encounter (Signed)
I want to check and see if his size is right.  He not look like he is 3 pounds.  My baby.   My Ob-Gyn says he is 3 lbs but he doesn't look like he is 3 lbs.   I want to see if his weight is right.    ? ?I want to know the baby's weight not mine.   Denies abd pain when I asked her. ?When the line disconnected I attempted to call her back but got a voicemail.   No message left.  I had given her the advice to contact her OB-GYN to obtain the baby's weight.   ? ? ? ?Reason for Disposition ? [1] Caller requesting NON-URGENT health information AND [2] PCP's office is the best resource ?   Instructed to call her OB-GYN doctor regarding getting her baby's weight. ? ?Answer Assessment - Initial Assessment Questions ?1. REASON FOR CALL or QUESTION: "What is your reason for calling today?" or "How can I best help you?" or "What question do you have that I can help answer?" ?    Pt called in on the [place] line wanting to know the weight of her baby.  She did not think he looks like 3 lbs like her Ob-GYN told her.   She doesn't want her weight but the baby's weight. ?Denied abd pain or problems when I asked her.   "He just doesn't look like 3 lbs to me".   I let her know they would need to do an ultrasound in order to see the size of the baby.   She said they did that and then either she hung up or the line disconnected. ? ?Protocols used: Information Only Call - No Triage-A-AH ? ?

## 2021-05-08 NOTE — Telephone Encounter (Signed)
?  Chief Complaint: Requesting what the weight of her baby is.   She is pregnant. ?Symptoms: Denies abd pain when asked  ?Frequency: N/A ?Pertinent Negatives: Patient denies vaginal bleeding or abd pain.  Just questioning the weight of the baby. ?Disposition: [] ED /[] Urgent Care (no appt availability in office) / [] Appointment(In office/virtual)/ []  [place] [facility]/ [x] Home Care/ [] Refused Recommended Disposition /[] [place] [facility]/ []  Follow-up with PCP ?Additional Notes:  ? ? ?Pt called back in after I tried to call her back when the line disconnected.  I wanted to be sure it's the right weight.   My stomach doesn't look like 3 pounds.  I'm [redacted] weeks pregnant.    They said he was head down.   I've had an ultrasound.   She has an ultrasound scheduled tomorrow.   Denies any bleeding vaginally or abd pain.   ? ?I instructed her to let her OB-GYN doctor know her concerns about the weight of the baby when she goes in for the ultrasound tomorrow.   She was agreeable to this plan. ?

## 2021-05-09 ENCOUNTER — Ambulatory Visit: Payer: Medicaid Other | Admitting: *Deleted

## 2021-05-09 ENCOUNTER — Ambulatory Visit: Payer: Medicaid Other | Attending: Obstetrics and Gynecology

## 2021-05-09 VITALS — BP 113/56 | HR 93

## 2021-05-09 DIAGNOSIS — O0990 Supervision of high risk pregnancy, unspecified, unspecified trimester: Secondary | ICD-10-CM | POA: Diagnosis present

## 2021-05-09 DIAGNOSIS — O09293 Supervision of pregnancy with other poor reproductive or obstetric history, third trimester: Secondary | ICD-10-CM | POA: Diagnosis not present

## 2021-05-09 DIAGNOSIS — Z362 Encounter for other antenatal screening follow-up: Secondary | ICD-10-CM

## 2021-05-09 DIAGNOSIS — D563 Thalassemia minor: Secondary | ICD-10-CM

## 2021-05-09 DIAGNOSIS — O365930 Maternal care for other known or suspected poor fetal growth, third trimester, not applicable or unspecified: Secondary | ICD-10-CM

## 2021-05-09 DIAGNOSIS — Z3A34 34 weeks gestation of pregnancy: Secondary | ICD-10-CM

## 2021-05-09 DIAGNOSIS — O99213 Obesity complicating pregnancy, third trimester: Secondary | ICD-10-CM

## 2021-05-09 DIAGNOSIS — O285 Abnormal chromosomal and genetic finding on antenatal screening of mother: Secondary | ICD-10-CM | POA: Diagnosis not present

## 2021-05-09 NOTE — Procedures (Signed)
Anita Hayes ?2000-02-18 ?[redacted]w[redacted]d ? ?Fetus A Non-Stress Test Interpretation for 05/09/21 ? ?Indication: IUGR ? ?Fetal Heart Rate A ?Mode: External ?Baseline Rate (A): 140 bpm ?Variability: Minimal, Moderate ?Accelerations: 15 x 15 ?Decelerations: None ?Multiple birth?: No ? ?Uterine Activity ?Mode: Palpation, Toco ?Contraction Frequency (min): none ?Resting Tone Palpated: Relaxed ? ?Interpretation (Fetal Testing) ?Nonstress Test Interpretation: Reactive ?Overall Impression: Reassuring for gestational age ?Comments: Dr. Judeth Cornfield reviewed tracing ? ? ?

## 2021-05-16 ENCOUNTER — Ambulatory Visit: Payer: Medicaid Other

## 2021-05-16 ENCOUNTER — Other Ambulatory Visit: Payer: Medicaid Other

## 2021-05-17 ENCOUNTER — Ambulatory Visit: Payer: Medicaid Other | Attending: Obstetrics and Gynecology

## 2021-05-17 ENCOUNTER — Ambulatory Visit: Payer: Medicaid Other | Admitting: *Deleted

## 2021-05-17 VITALS — BP 119/60 | HR 98

## 2021-05-17 DIAGNOSIS — Z3A35 35 weeks gestation of pregnancy: Secondary | ICD-10-CM

## 2021-05-17 DIAGNOSIS — O285 Abnormal chromosomal and genetic finding on antenatal screening of mother: Secondary | ICD-10-CM | POA: Diagnosis not present

## 2021-05-17 DIAGNOSIS — O0990 Supervision of high risk pregnancy, unspecified, unspecified trimester: Secondary | ICD-10-CM | POA: Diagnosis present

## 2021-05-17 DIAGNOSIS — D573 Sickle-cell trait: Secondary | ICD-10-CM | POA: Diagnosis not present

## 2021-05-17 DIAGNOSIS — O0933 Supervision of pregnancy with insufficient antenatal care, third trimester: Secondary | ICD-10-CM | POA: Diagnosis not present

## 2021-05-17 DIAGNOSIS — O09293 Supervision of pregnancy with other poor reproductive or obstetric history, third trimester: Secondary | ICD-10-CM | POA: Diagnosis not present

## 2021-05-17 DIAGNOSIS — O365930 Maternal care for other known or suspected poor fetal growth, third trimester, not applicable or unspecified: Secondary | ICD-10-CM

## 2021-05-17 DIAGNOSIS — E669 Obesity, unspecified: Secondary | ICD-10-CM

## 2021-05-17 DIAGNOSIS — O365931 Maternal care for other known or suspected poor fetal growth, third trimester, fetus 1: Secondary | ICD-10-CM | POA: Diagnosis not present

## 2021-05-17 DIAGNOSIS — D563 Thalassemia minor: Secondary | ICD-10-CM | POA: Diagnosis not present

## 2021-05-17 DIAGNOSIS — O99213 Obesity complicating pregnancy, third trimester: Secondary | ICD-10-CM | POA: Diagnosis not present

## 2021-05-20 ENCOUNTER — Telehealth (INDEPENDENT_AMBULATORY_CARE_PROVIDER_SITE_OTHER): Payer: Medicaid Other | Admitting: Family Medicine

## 2021-05-20 ENCOUNTER — Telehealth: Payer: Self-pay

## 2021-05-20 ENCOUNTER — Encounter: Payer: Medicaid Other | Admitting: Family Medicine

## 2021-05-20 DIAGNOSIS — O365930 Maternal care for other known or suspected poor fetal growth, third trimester, not applicable or unspecified: Secondary | ICD-10-CM

## 2021-05-20 DIAGNOSIS — O0990 Supervision of high risk pregnancy, unspecified, unspecified trimester: Secondary | ICD-10-CM

## 2021-05-20 DIAGNOSIS — Z2839 Other underimmunization status: Secondary | ICD-10-CM

## 2021-05-20 DIAGNOSIS — O09893 Supervision of other high risk pregnancies, third trimester: Secondary | ICD-10-CM

## 2021-05-20 DIAGNOSIS — Z3A35 35 weeks gestation of pregnancy: Secondary | ICD-10-CM

## 2021-05-20 DIAGNOSIS — Q256 Stenosis of pulmonary artery: Secondary | ICD-10-CM

## 2021-05-20 DIAGNOSIS — O0993 Supervision of high risk pregnancy, unspecified, third trimester: Secondary | ICD-10-CM

## 2021-05-20 DIAGNOSIS — D563 Thalassemia minor: Secondary | ICD-10-CM

## 2021-05-20 DIAGNOSIS — Z139 Encounter for screening, unspecified: Secondary | ICD-10-CM

## 2021-05-20 DIAGNOSIS — Q249 Congenital malformation of heart, unspecified: Secondary | ICD-10-CM

## 2021-05-20 DIAGNOSIS — O09899 Supervision of other high risk pregnancies, unspecified trimester: Secondary | ICD-10-CM

## 2021-05-20 NOTE — Patient Instructions (Signed)

## 2021-05-20 NOTE — Progress Notes (Signed)
? ?OBSTETRICS PRENATAL VIRTUAL VISIT ENCOUNTER NOTE ? ?Provider location: [facility] for [employer] at [facility]  ? ?Patient location: Home ? ?I connected with Anita Hayes on 05/20/21 at  4:05 PM EDT by MyChart Video Encounter and verified that I am speaking with the correct person using two identifiers. I discussed the limitations, risks, security and privacy concerns of performing an evaluation and management service virtually and the availability of in person appointments. I also discussed with the patient that there may be a patient responsible charge related to this service. The patient expressed understanding and agreed to proceed. ?Subjective:  ?Anita Hayes is a 21 y.o. G2P1001 at [redacted]w[redacted]d being seen today for ongoing prenatal care.  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 ? ?Patient reports no complaints.  Contractions: Irritability. Vag. Bleeding: None.  Movement: Present. Denies any leaking of fluid.  ? ?The following portions of the patient's history were reviewed and updated as appropriate: allergies, current medications, past family history, past medical history, past social history, past surgical history and problem list.  ? ?Objective:  ?There were no vitals filed for this visit. ? ?Fetal Status:     Movement: Present    ? ?General:  Alert, oriented and cooperative. Patient is in no acute distress.  ?Respiratory: Normal  respiratory effort, no problems with respiration noted  ?Mental Status: Normal mood and affect. Normal behavior. Normal judgment and thought content.  ?Rest of physical exam deferred due to type of encounter ? ?Imaging: ?[place] MFM FETAL BPP W/NONSTRESS ? ?Result Date: 05/09/2021 ?----------------------------------------------------------------------  OBSTETRICS REPORT                       (Signed Final 05/09/2021 04:35 pm) ---------------------------------------------------------------------- Patient Info  ID #:       [id number]                          D.O.B.:  [birth year] (21 yrs)  Name:       Anita Hayes                   Visit Date: 05/09/2021 02:44 pm ---------------------------------------------------------------------- Performed By  Attending:        Tama High MD        Ref. Address:     [address]                                                             [place], [place]  [id number]  Performed By:     Georgie Chard        [facility]                                     [facility] at                                                             [place] for                                                             Women  Referred By:      [facility] ---------------------------------------------------------------------- Orders  #  Description                           Code        Ordered By  1  [place] MFM UA CORD DOPPLER                76820.02    RAVI SHANKAR  2  [place] MFM FETAL BPP                      [id number]     RAVI [facility]     W/NONSTRESS ----------------------------------------------------------------------  #  Order #                     Accession #                Episode #  1  [id number]                   [id number]                 [id number]  2  [id number]                   [id number]                 [id number]  ---------------------------------------------------------------------- Indications  Maternal care for known or suspected poor      O36.5930  fetal growth, third trimester, not applicable or  unspecified IUGR  Obesity complicating pregnancy, third          O99.213  trimester (pregravid BMI 40)  Late to prenatal care, third trimester         O09.33  Poor obstetric history: Previous               O09.299  preeclampsia / eclampsia/gestational HTN  Genetic carrier (Alpha Thal & Sickle Cell      Z14.8  Disease)  Encounter for other antenatal screening        Z36.2  follow-up  [redacted] weeks gestation of pregnancy                Z3A.34  Low Risk NIPS  Neg AFP ---------------------------------------------------------------------- Fetal Evaluation  Num Of Fetuses:         1  Fetal Heart Rate(bpm):  153  Cardiac Activity:       Observed  Presentation:           Cephalic  Placenta:               Posterior  P. Cord Insertion:      Previously Visualized  Amniotic Fluid  AFI FV:      Within normal limits  AFI Sum(cm)     %Tile       Largest Pocket(cm)  12              34          5.34  RUQ(cm)       RLQ(cm)       LUQ(cm)        LLQ(cm)  2.18          2.63          5.34           1.85 ---------------------------------------------------------------------- Biophysical Evaluation  Amniotic F.V:   Within normal limits       F. Tone:        Observed  F. Movement:    Observed                   N.S.T:          Reactive  F. Breathing:   Observed                   Score:          10/10 ---------------------------------------------------------------------- OB History  Gravidity:    2         Term:   1        Prem:   0        SAB:   0  TOP:          0       Ectopic:  0        Living: 1 ---------------------------------------------------------------------- Gestational Age  LMP:           34w 0d        Date:  09/13/20                  EDD:   06/20/21  Best:          34w 0d     Det. By:  LMP  (09/13/20)          EDD:   06/20/21  ---------------------------------------------------------------------- Doppler - Fetal Vessels  Umbilical Artery   S/D     %tile      RI    %tile                             ADFV    RDFV   2.16       25    0.54       25                                No      No ---------------------------------------------------------------------- Impression  Severe fetal growth restriction.  On ultrasound performed last  week, the estimated fetal weight was at the 2nd percentile.  Amniotic fluid is normal and good fetal activity seen.  Umbilical artery Doppler showed normal forward diastolic  flow.  Antenatal testing is reassuring.  NST is reactive.  BPP  10/10. ---------------------------------------------------------------------- Recommendations  -Continue weekly BPP and UA Doppler studies till delivery.  Delivery at [redacted] weeks gestation. ----------------------------------------------------------------------                 Tama High, MD Electronically Signed Final Report   05/09/2021 04:35 pm ---------------------------------------------------------------------- ? ?[place] MFM FETAL BPP W/NONSTRESS ? ?Result Date: 05/03/2021 ?----------------------------------------------------------------------  OBSTETRICS REPORT                       (Signed Final 05/03/2021 03:12 pm) ---------------------------------------------------------------------- Patient Info  ID #:       [id number]                          D.O.B.:  2000/09/21 (21 yrs)  Name:       Anita Hayes                   Visit Date: 05/03/2021 02:01 pm ---------------------------------------------------------------------- Performed By  Attending:        Tama High MD        Ref. Address:     [address]                                                             [place], [place]  Performed By:     Valda Favia          Location:         [facility]                                      Fetal Care at                                                             [place] for                                                             Women  Referred By:      [facility] ---------------------------------------------------------------------- [place]

## 2021-05-20 NOTE — Progress Notes (Signed)
Addressed via telephone encounter

## 2021-05-20 NOTE — Progress Notes (Signed)
I connected with  Anita Hayes on 05/20/21 at  4:05 PM EDT by MyChart video and verified that I am speaking with the correct person using two identifiers. ?  ?I discussed the limitations, risks, security and privacy concerns of performing an evaluation and management service by telephone and the availability of in person appointments. I also discussed with the patient that there may be a patient responsible charge related to this service. The patient expressed understanding and agreed to proceed. ? ?Marjo Bicker, RN ?05/20/2021  4:06 PM ? ?

## 2021-05-20 NOTE — Telephone Encounter (Signed)
Called pt to follow up on missed appt today. Patient is agreeable to virtual appt. ?

## 2021-05-21 ENCOUNTER — Telehealth (HOSPITAL_COMMUNITY): Payer: Self-pay | Admitting: *Deleted

## 2021-05-21 NOTE — Telephone Encounter (Signed)
Preadmission screen  

## 2021-05-22 ENCOUNTER — Other Ambulatory Visit: Payer: Self-pay | Admitting: Advanced Practice Midwife

## 2021-05-23 ENCOUNTER — Ambulatory Visit (INDEPENDENT_AMBULATORY_CARE_PROVIDER_SITE_OTHER): Payer: Medicaid Other | Admitting: Family Medicine

## 2021-05-23 ENCOUNTER — Other Ambulatory Visit (HOSPITAL_COMMUNITY)
Admission: RE | Admit: 2021-05-23 | Discharge: 2021-05-23 | Disposition: A | Discharge status: Home / Self Care | Payer: Medicaid Other | Source: Ambulatory Visit | Attending: Obstetrics & Gynecology | Admitting: Obstetrics & Gynecology

## 2021-05-23 ENCOUNTER — Encounter (HOSPITAL_COMMUNITY): Payer: Self-pay | Admitting: *Deleted

## 2021-05-23 ENCOUNTER — Telehealth (HOSPITAL_COMMUNITY): Payer: Self-pay | Admitting: *Deleted

## 2021-05-23 VITALS — BP 118/70 | HR 100 | Wt 238.5 lb

## 2021-05-23 DIAGNOSIS — O0990 Supervision of high risk pregnancy, unspecified, unspecified trimester: Secondary | ICD-10-CM | POA: Diagnosis not present

## 2021-05-23 LAB — OB RESULTS CONSOLE GC/CHLAMYDIA: Gonorrhea: NEGATIVE

## 2021-05-23 NOTE — Progress Notes (Signed)
? ?  PRENATAL VISIT NOTE ? ?Subjective:  ?Anita Hayes is a 21 y.o. G2P1001 at [redacted]w[redacted]d being seen today for ongoing prenatal care.  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 ? ?Patient reports no complaints.  Contractions: Irritability. Vag. Bleeding: None.  Movement: Present. Denies leaking of fluid.  ? ?The following portions of the patient's history were reviewed and updated as appropriate: allergies, current medications, past family history, past medical history, past social history, past surgical history and problem list.  ? ?Objective:  ? ?Vitals:  ? 05/23/21 1056  ?BP: 118/70  ?Pulse: 100  ?Weight: 238 lb 8 oz (108.2 kg)  ? ? ?Fetal Status: Fetal Heart Rate (bpm): 144   Movement: Present    ? ?General:  Alert, oriented and cooperative. Patient is in no acute distress.  ?Skin: Skin is warm and dry. No rash noted.   ?Cardiovascular: Normal heart rate noted  ?Respiratory: Normal respiratory effort, no problems with respiration noted  ?Abdomen: Soft, gravid, appropriate for gestational age.  Pain/Pressure: Present     ?Pelvic: Cervical exam performed in the presence of a chaperone        ?Extremities: Normal range of motion.  Edema: None  ?Mental Status: Normal mood and affect. Normal behavior. Normal judgment and thought content.  ? ?Assessment and Plan:  ?Pregnancy: G2P1001 at [redacted]w[redacted]d ?1. Supervision of high risk pregnancy, antepartum ?Continue  prenatal care. ?Seen for virtual visit earlier this week and this was just for needed cultures ?IOL for FGR scheduled at 37 weeks ?- Cervicovaginal ancillary only( [place]) ?- Culture, beta strep (group b only) ? ?Preterm labor symptoms and general obstetric precautions including but not limited to vaginal bleeding, contractions, leaking of fluid and fetal movement were reviewed in detail with the patient. ?Please refer to After Visit Summary for other counseling recommendations.  ? ?No follow-ups on file. ? ?Future Appointments  ?Date Time Provider [place]  ?05/23/2021 11:15 AM Donnamae Jude, MD [facility]  ?05/24/2021  1:30 PM [facility] NURSE [facility]  ?05/24/2021  1:45 PM [facility] US6 [facility]  ?05/30/2021  7:15 AM [facility] SCHED ROOM [facility]IN[facility] None  ?06/21/2021  1:20 PM Tobb, Godfrey Pick, DO [facility] None  ? ? ?Donnamae Jude, MD ? ?

## 2021-05-23 NOTE — Telephone Encounter (Signed)
Preadmission screen  

## 2021-05-24 ENCOUNTER — Ambulatory Visit: Payer: Medicaid Other

## 2021-05-24 DIAGNOSIS — I361 Nonrheumatic tricuspid (valve) insufficiency: Secondary | ICD-10-CM | POA: Diagnosis not present

## 2021-05-24 DIAGNOSIS — O99412 Diseases of the circulatory system complicating pregnancy, second trimester: Secondary | ICD-10-CM | POA: Diagnosis not present

## 2021-05-24 DIAGNOSIS — I517 Cardiomegaly: Secondary | ICD-10-CM | POA: Diagnosis not present

## 2021-05-24 DIAGNOSIS — Q249 Congenital malformation of heart, unspecified: Secondary | ICD-10-CM | POA: Diagnosis not present

## 2021-05-24 DIAGNOSIS — Z8279 Family history of other congenital malformations, deformations and chromosomal abnormalities: Secondary | ICD-10-CM | POA: Diagnosis not present

## 2021-05-24 LAB — CERVICOVAGINAL ANCILLARY ONLY
Chlamydia: NEGATIVE
Comment: NEGATIVE
Comment: NORMAL
Neisseria Gonorrhea: NEGATIVE

## 2021-05-26 ENCOUNTER — Encounter: Payer: Self-pay | Admitting: Family Medicine

## 2021-05-26 LAB — CULTURE, BETA STREP (GROUP B ONLY): Strep Gp B Culture: POSITIVE — AB

## 2021-05-27 ENCOUNTER — Encounter: Payer: Self-pay | Admitting: Family Medicine

## 2021-05-27 DIAGNOSIS — O99820 Streptococcus B carrier state complicating pregnancy: Secondary | ICD-10-CM | POA: Insufficient documentation

## 2021-05-27 HISTORY — DX: Streptococcus B carrier state complicating pregnancy: O99.820

## 2021-05-28 ENCOUNTER — Ambulatory Visit: Payer: Medicaid Other | Admitting: *Deleted

## 2021-05-28 ENCOUNTER — Other Ambulatory Visit: Payer: Self-pay | Admitting: Obstetrics and Gynecology

## 2021-05-28 ENCOUNTER — Ambulatory Visit: Payer: Medicaid Other | Attending: Obstetrics and Gynecology

## 2021-05-28 VITALS — BP 127/62 | HR 101

## 2021-05-28 DIAGNOSIS — O0990 Supervision of high risk pregnancy, unspecified, unspecified trimester: Secondary | ICD-10-CM | POA: Diagnosis present

## 2021-05-28 DIAGNOSIS — D563 Thalassemia minor: Secondary | ICD-10-CM

## 2021-05-28 DIAGNOSIS — O365931 Maternal care for other known or suspected poor fetal growth, third trimester, fetus 1: Secondary | ICD-10-CM

## 2021-05-28 DIAGNOSIS — O99820 Streptococcus B carrier state complicating pregnancy: Secondary | ICD-10-CM | POA: Diagnosis present

## 2021-05-28 DIAGNOSIS — O285 Abnormal chromosomal and genetic finding on antenatal screening of mother: Secondary | ICD-10-CM

## 2021-05-28 DIAGNOSIS — O365930 Maternal care for other known or suspected poor fetal growth, third trimester, not applicable or unspecified: Secondary | ICD-10-CM | POA: Diagnosis not present

## 2021-05-28 DIAGNOSIS — O09293 Supervision of pregnancy with other poor reproductive or obstetric history, third trimester: Secondary | ICD-10-CM | POA: Diagnosis not present

## 2021-05-28 DIAGNOSIS — O0933 Supervision of pregnancy with insufficient antenatal care, third trimester: Secondary | ICD-10-CM | POA: Diagnosis not present

## 2021-05-28 DIAGNOSIS — D573 Sickle-cell trait: Secondary | ICD-10-CM

## 2021-05-28 DIAGNOSIS — O99213 Obesity complicating pregnancy, third trimester: Secondary | ICD-10-CM

## 2021-05-28 DIAGNOSIS — Z3A36 36 weeks gestation of pregnancy: Secondary | ICD-10-CM

## 2021-05-28 DIAGNOSIS — E669 Obesity, unspecified: Secondary | ICD-10-CM

## 2021-05-28 NOTE — Procedures (Signed)
Anita Hayes ?Dec 14, 2000 ?[redacted]w[redacted]d ? ?Fetus A Non-Stress Test Interpretation for 05/28/21 ? ?Indication: IUGR ? ?Fetal Heart Rate A ?Mode: External ?Baseline Rate (A): 155 bpm ?Variability: Moderate ?Accelerations: 15 x 15 ?Decelerations: None ?Multiple birth?: No ? ?Uterine Activity ?Mode: Toco ?Contraction Frequency (min): ui ?Contraction Quality: Mild ?Resting Tone Palpated: Relaxed ? ?Interpretation (Fetal Testing) ?Nonstress Test Interpretation: Reactive ?Overall Impression: Reassuring for gestational age ?Comments: tracing reviewed by Dr. Judeth Cornfield ? ? ?

## 2021-05-30 ENCOUNTER — Inpatient Hospital Stay (HOSPITAL_COMMUNITY): Payer: Medicaid Other

## 2021-05-30 ENCOUNTER — Inpatient Hospital Stay (HOSPITAL_COMMUNITY): Payer: Medicaid Other | Admitting: Anesthesiology

## 2021-05-30 ENCOUNTER — Other Ambulatory Visit: Payer: Self-pay

## 2021-05-30 ENCOUNTER — Inpatient Hospital Stay (HOSPITAL_COMMUNITY)
Admission: AD | Admit: 2021-05-30 | Discharge: 2021-06-02 | DRG: 807 | Disposition: A | Discharge status: Home / Self Care | Payer: Medicaid Other | Attending: Obstetrics and Gynecology | Admitting: Obstetrics and Gynecology

## 2021-05-30 ENCOUNTER — Encounter (HOSPITAL_COMMUNITY): Payer: Self-pay | Admitting: Family Medicine

## 2021-05-30 DIAGNOSIS — D573 Sickle-cell trait: Secondary | ICD-10-CM | POA: Diagnosis not present

## 2021-05-30 DIAGNOSIS — Z3A37 37 weeks gestation of pregnancy: Secondary | ICD-10-CM

## 2021-05-30 DIAGNOSIS — O99824 Streptococcus B carrier state complicating childbirth: Secondary | ICD-10-CM | POA: Diagnosis present

## 2021-05-30 DIAGNOSIS — O164 Unspecified maternal hypertension, complicating childbirth: Secondary | ICD-10-CM | POA: Diagnosis not present

## 2021-05-30 DIAGNOSIS — O365990 Maternal care for other known or suspected poor fetal growth, unspecified trimester, not applicable or unspecified: Secondary | ICD-10-CM | POA: Diagnosis present

## 2021-05-30 DIAGNOSIS — O9902 Anemia complicating childbirth: Secondary | ICD-10-CM | POA: Diagnosis not present

## 2021-05-30 DIAGNOSIS — D563 Thalassemia minor: Principal | ICD-10-CM

## 2021-05-30 DIAGNOSIS — O99820 Streptococcus B carrier state complicating pregnancy: Secondary | ICD-10-CM

## 2021-05-30 DIAGNOSIS — O135 Gestational [pregnancy-induced] hypertension without significant proteinuria, complicating the puerperium: Secondary | ICD-10-CM | POA: Diagnosis not present

## 2021-05-30 DIAGNOSIS — Z23 Encounter for immunization: Secondary | ICD-10-CM

## 2021-05-30 DIAGNOSIS — O365930 Maternal care for other known or suspected poor fetal growth, third trimester, not applicable or unspecified: Principal | ICD-10-CM | POA: Diagnosis present

## 2021-05-30 DIAGNOSIS — O99214 Obesity complicating childbirth: Secondary | ICD-10-CM | POA: Diagnosis present

## 2021-05-30 DIAGNOSIS — O0990 Supervision of high risk pregnancy, unspecified, unspecified trimester: Secondary | ICD-10-CM

## 2021-05-30 HISTORY — DX: Maternal care for other known or suspected poor fetal growth, unspecified trimester, not applicable or unspecified: O36.5990

## 2021-05-30 LAB — CBC
HCT: 35.7 % — ABNORMAL LOW (ref 36.0–46.0)
Hemoglobin: 11.2 g/dL — ABNORMAL LOW (ref 12.0–15.0)
MCH: 22.8 pg — ABNORMAL LOW (ref 26.0–34.0)
MCHC: 31.4 g/dL (ref 30.0–36.0)
MCV: 72.7 fL — ABNORMAL LOW (ref 80.0–100.0)
Platelets: 160 10*3/uL (ref 150–400)
RBC: 4.91 MIL/uL (ref 3.87–5.11)
RDW: 14.9 % (ref 11.5–15.5)
WBC: 7.6 10*3/uL (ref 4.0–10.5)
nRBC: 0 % (ref 0.0–0.2)

## 2021-05-30 LAB — TYPE AND SCREEN
ABO/RH(D): A POS
Antibody Screen: NEGATIVE

## 2021-05-30 LAB — RPR: RPR Ser Ql: NONREACTIVE

## 2021-05-30 MED ORDER — OXYTOCIN BOLUS FROM INFUSION
333.0000 mL | Freq: Once | INTRAVENOUS | Status: AC
  Administered 2021-05-31: 333 mL via INTRAVENOUS

## 2021-05-30 MED ORDER — FLEET ENEMA 7-19 GM/118ML RE ENEM: 1.0000 | ENEMA | RECTAL | Status: DC | PRN

## 2021-05-30 MED ORDER — ACETAMINOPHEN 325 MG PO TABS: 650.0000 mg | ORAL_TABLET | ORAL | Status: DC | PRN

## 2021-05-30 MED ORDER — DIPHENHYDRAMINE HCL 50 MG/ML IJ SOLN: 12.5000 mg | INTRAMUSCULAR | Status: DC | PRN

## 2021-05-30 MED ORDER — LIDOCAINE HCL (PF) 1 % IJ SOLN
INTRAMUSCULAR | Status: DC | PRN
Start: 2021-05-30 — End: 2021-05-31
  Administered 2021-05-30: 10 mL via EPIDURAL

## 2021-05-30 MED ORDER — ONDANSETRON HCL 4 MG/2ML IJ SOLN: 4.0000 mg | Freq: Four times a day (QID) | INTRAMUSCULAR | Status: DC | PRN

## 2021-05-30 MED ORDER — SOD CITRATE-CITRIC ACID 500-334 MG/5ML PO SOLN: 30.0000 mL | ORAL | Status: DC | PRN

## 2021-05-30 MED ORDER — PENICILLIN G POT IN DEXTROSE 60000 UNIT/ML IV SOLN
3.0000 10*6.[IU] | INTRAVENOUS | Status: DC
  Administered 2021-05-30 – 2021-05-31 (×4): 3 10*6.[IU] via INTRAVENOUS
  Filled 2021-05-30 (×7): qty 50

## 2021-05-30 MED ORDER — OXYTOCIN-SODIUM CHLORIDE 30-0.9 UT/500ML-% IV SOLN: 2.5000 [IU]/h | INTRAVENOUS | Status: DC

## 2021-05-30 MED ORDER — SODIUM CHLORIDE 0.9 % IV SOLN
5.0000 10*6.[IU] | Freq: Once | INTRAVENOUS | Status: AC
  Administered 2021-05-30: 5 10*6.[IU] via INTRAVENOUS
  Filled 2021-05-30: qty 5

## 2021-05-30 MED ORDER — FENTANYL CITRATE (PF) 100 MCG/2ML IJ SOLN
100.0000 ug | INTRAMUSCULAR | Status: DC | PRN
  Administered 2021-05-30: 100 ug via INTRAVENOUS
  Filled 2021-05-30: qty 2

## 2021-05-30 MED ORDER — FENTANYL-BUPIVACAINE-NACL 0.5-0.125-0.9 MG/250ML-% EP SOLN
12.0000 mL/h | EPIDURAL | Status: DC | PRN
  Administered 2021-05-30: 12 mL/h via EPIDURAL
  Filled 2021-05-30: qty 250

## 2021-05-30 MED ORDER — OXYTOCIN-SODIUM CHLORIDE 30-0.9 UT/500ML-% IV SOLN
1.0000 m[IU]/min | INTRAVENOUS | Status: DC
  Administered 2021-05-30: 1 m[IU]/min via INTRAVENOUS

## 2021-05-30 MED ORDER — LACTATED RINGERS IV SOLN: INTRAVENOUS | Status: DC

## 2021-05-30 MED ORDER — FENTANYL CITRATE (PF) 100 MCG/2ML IJ SOLN
INTRAMUSCULAR | Status: AC
  Administered 2021-05-30: 100 ug
  Filled 2021-05-30: qty 2

## 2021-05-30 MED ORDER — PHENYLEPHRINE 80 MCG/ML (10ML) SYRINGE FOR IV PUSH (FOR BLOOD PRESSURE SUPPORT)
80.0000 ug | PREFILLED_SYRINGE | INTRAVENOUS | Status: DC | PRN
  Filled 2021-05-30: qty 10

## 2021-05-30 MED ORDER — OXYCODONE-ACETAMINOPHEN 5-325 MG PO TABS: 2.0000 | ORAL_TABLET | ORAL | Status: DC | PRN

## 2021-05-30 MED ORDER — PHENYLEPHRINE 80 MCG/ML (10ML) SYRINGE FOR IV PUSH (FOR BLOOD PRESSURE SUPPORT): 80.0000 ug | PREFILLED_SYRINGE | INTRAVENOUS | Status: DC | PRN

## 2021-05-30 MED ORDER — LIDOCAINE HCL (PF) 1 % IJ SOLN: 30.0000 mL | INTRAMUSCULAR | Status: DC | PRN

## 2021-05-30 MED ORDER — EPHEDRINE 5 MG/ML INJ: 10.0000 mg | INTRAVENOUS | Status: DC | PRN

## 2021-05-30 MED ORDER — OXYTOCIN-SODIUM CHLORIDE 30-0.9 UT/500ML-% IV SOLN
1.0000 m[IU]/min | INTRAVENOUS | Status: DC
  Filled 2021-05-30: qty 500

## 2021-05-30 MED ORDER — TERBUTALINE SULFATE 1 MG/ML IJ SOLN: 0.2500 mg | Freq: Once | INTRAMUSCULAR | Status: DC | PRN

## 2021-05-30 MED ORDER — LACTATED RINGERS IV SOLN: 500.0000 mL | INTRAVENOUS | Status: DC | PRN

## 2021-05-30 MED ORDER — MISOPROSTOL 50MCG HALF TABLET
50.0000 ug | ORAL_TABLET | Freq: Once | ORAL | Status: AC
  Administered 2021-05-30: 50 ug via BUCCAL
  Filled 2021-05-30: qty 1

## 2021-05-30 MED ORDER — OXYTOCIN-SODIUM CHLORIDE 30-0.9 UT/500ML-% IV SOLN: 1.0000 m[IU]/min | INTRAVENOUS | Status: DC

## 2021-05-30 MED ORDER — OXYCODONE-ACETAMINOPHEN 5-325 MG PO TABS: 1.0000 | ORAL_TABLET | ORAL | Status: DC | PRN

## 2021-05-30 MED ORDER — LACTATED RINGERS IV SOLN: 500.0000 mL | Freq: Once | INTRAVENOUS | Status: DC

## 2021-05-30 NOTE — Anesthesia Procedure Notes (Signed)
Epidural ?Patient location during procedure: OB ?Start time: 05/30/2021 8:23 PM ?End time: 05/30/2021 8:32 PM ? ?Staffing ?Anesthesiologist: Lucretia Kern, MD ?Performed: anesthesiologist  ? ?Preanesthetic Checklist ?Completed: patient identified, IV checked, risks and benefits discussed, monitors and equipment checked, pre-op evaluation and timeout performed ? ?Epidural ?Patient position: sitting ?Prep: DuraPrep ?Patient monitoring: heart rate, continuous pulse ox and blood pressure ?Approach: midline ?Location: L3-L4 ?Injection technique: LOR air ? ?Needle:  ?Needle type: Tuohy  ?Needle gauge: 17 G ?Needle length: 9 cm ?Needle insertion depth: 6 cm ?Catheter type: closed end flexible ?Catheter size: 19 Gauge ?Catheter at skin depth: 11 cm ?Test dose: negative ? ?Assessment ?Events: blood not aspirated, injection not painful, no injection resistance, no paresthesia and negative IV test ? ?Additional Notes ?Reason for block:procedure for pain ? ? ? ?

## 2021-05-30 NOTE — Anesthesia Preprocedure Evaluation (Signed)
Anesthesia Evaluation  ?Patient identified by MRN, date of birth, ID band ?Patient awake ? ? ? ?Reviewed: ?Allergy & Precautions, H&P , NPO status , Patient's Chart, lab work & pertinent test results ? ?History of Anesthesia Complications ?Negative for: history of anesthetic complications ? ?Airway ?Mallampati: II ? ?TM Distance: >3 FB ? ? ? ? Dental ?  ?Pulmonary ?neg pulmonary ROS,  ?  ?Pulmonary exam normal ? ? ? ? ? ? ? Cardiovascular ?hypertension, negative cardio ROS ? ? ?Rhythm:regular Rate:Normal ? ? ?H/o pulmonary stenosis, however 2021 echo showed normal pulmonic valves, pulmonary trunk and pulmonary arteries, no evidence for stenosis; otherwise unremarkable, normal EF ?  ?Neuro/Psych ?negative neurological ROS ? negative psych ROS  ? GI/Hepatic ?negative GI ROS, Neg liver ROS,   ?Endo/Other  ?Morbid obesity ? Renal/GU ?  ? ?  ?Musculoskeletal ? ? Abdominal ?  ?Peds ? Hematology ? ?(+) Blood dyscrasia, Sickle cell trait ,   ?Anesthesia Other Findings ? ? Reproductive/Obstetrics ?(+) Pregnancy ? ?  ? ? ? ? ? ? ? ? ? ? ? ? ? ?  ?  ? ? ? ? ? ? ? ? ?Anesthesia Physical ?Anesthesia Plan ? ?ASA: 3 ? ?Anesthesia Plan: Epidural  ? ?Post-op Pain Management:   ? ?Induction:  ? ?PONV Risk Score and Plan:  ? ?Airway Management Planned:  ? ?Additional Equipment:  ? ?Intra-op Plan:  ? ?Post-operative Plan:  ? ?Informed Consent: I have reviewed the patients History and Physical, chart, labs and discussed the procedure including the risks, benefits and alternatives for the proposed anesthesia with the patient or authorized representative who has indicated his/her understanding and acceptance.  ? ? ? ? ? ?Plan Discussed with:  ? ?Anesthesia Plan Comments:   ? ? ? ? ? ? ?Anesthesia Quick Evaluation ? ?

## 2021-05-30 NOTE — Progress Notes (Signed)
Anita Hayes is a 21 y.o. G2P1001 at [redacted]w[redacted]d  admitted for induction of labor due to Poor fetal growth. ? ?Subjective: ? ?Coping well. She states contractions are getting stronger, but not too bad at this time.  ? ?Objective: ?BP (!) 127/56   Pulse 91   Temp 98.6 ?F (37 ?C) (Oral)   Resp 16   Ht 5\' 3"  (1.6 m)   Wt 106.7 kg   LMP 09/13/2020 (Approximate)   BMI 41.66 kg/m?  ?No intake/output data recorded. ?No intake/output data recorded. ? ?FHT:  FHR: 145 bpm, variability: moderate,  accelerations:  Present,  decelerations:  Absent ?UC:   regular, every 2-3 minutes ?SVE:   Dilation: 2 ?Effacement (%): Thick ?Station: -3 ?Exam by:: Nikolaj Geraghty, cnm ? ?Procedure: ?Patient informed of R/B/A of procedure.  Patient has been on the monitor with a reactive tracing as of the start of the procedure.  ? ?Procedure done to begin ripening of the cervix for induction of labor. ?Appropriate time out taken. The patient was placed in the lithotomy position and a cervical exam was performed and a finger was used to guide the 47F foley", Cook Catheter through the internal os of the cervix. Foley Balloon filled with 60cc of normal saline. Plug inserted into end of the foley. Foley placed on tension and taped to medial thigh.  ? ? ?Labs: ?Lab Results  ?Component Value Date  ? WBC 7.6 05/30/2021  ? HGB 11.2 (L) 05/30/2021  ? HCT 35.7 (L) 05/30/2021  ? MCV 72.7 (L) 05/30/2021  ? PLT 160 05/30/2021  ? ? ?Assessment / Plan: ?IOL 2/2 IUGR ?Has had one dose of cytotec ?FB placed at this time ?Will start pitocin 1x1 to a max of 6 until FB comes out.  ? ?Labor:  Still in cervical ripening phase of IOL  ?Preeclampsia:   NA ?Fetal Wellbeing:  Category I ?Pain Control:  Labor support without medications ?I/D:  n/a ?Anticipated MOD:  NSVD ? ?06/01/2021 DNP, CNM  ?05/30/21  2:40 PM  ? ? ? ?

## 2021-05-30 NOTE — H&P (Addendum)
OBSTETRIC ADMISSION HISTORY AND PHYSICAL ? ?Anita Hayes is a 21 y.o. female G2P1001 with IUP at [redacted]w[redacted]d by LMP presenting for IOL d/t IUGR. She reports +FMs, No LOF, no VB, no blurry vision, headaches or peripheral edema, and RUQ pain.  She plans on bottle feeding. She is undecided for birth control. ?She received her prenatal care at [facility]  ? ?Dating: By LMP --->  Estimated Date of Delivery: 06/20/21 ? ?Sono:   ? ?@[redacted]w[redacted]d, CWD, normal anatomy, Cephalic presentation,  1961g, <1% EFW ? ? 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 ?Past Medical History: ?Past Medical History:  ?Diagnosis Date  ? Acne 07/16/2016  ? Anemia   ? Chlamydia infection affecting pregnancy in first trimester 11/21/2020  ? Gonorrhea affecting pregnancy 11/22/2019  ? [ ] tell peds at delivery [ ] toc late nov +11/17/19  ? History of chlamydia 11/17/2019  ? 9/14 positive, neg 10/16  ? Pregnancy   ? Pregnancy induced hypertension   ? GHTN with first preg  ? Pulmonary stenosis   ? Sickle cell trait (H[facility])   ? Sickle cell trait (H[facility])   ? 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 [facility] Pediatric Echo Transthoracic Report  INTERPRETATION SUMMARY Trivial left pulmonary artery stenosis, peak gradient = 16 mmHg.      ? ? ?Past Surgical History: ?Past Surgical History:  ?Procedure Laterality Date  ? ADENOIDECTOMY    ? TONSILLECTOMY AND ADENOIDECTOMY    ? ? ?Obstetrical History: ?OB History   ? ? Gravida  ?2  ? Para  ?1  ? Term  ?1  ? Preterm  ?   ? AB  ?   ? Living  ?1  ?  ? ? SAB  ?   ? IAB  ?   ? Ectopic  ?   ? Multiple  ?0  ? Live Births  ?1  ?   ?  ?  ? ? ?Social History ?Social History  ? ?Socioeconomic History  ? Marital status: Single  ?  Spouse name: Not on file  ? Number of children: Not on file  ? Years of education: Not on file  ? Highest education level: Not  on file  ?Occupational History  ? Not on file  ?Tobacco Use  ? Smoking status: Never  ? Smokeless tobacco: Never  ?Vaping Use  ? Vaping Use: Never used  ?Substance and Sexual Activity  ? Alcohol use: Never  ? Drug use: Never  ? Sexual activity: Not Currently  ?Other Topics Concern  ? Not on file  ?Social History Narrative  ? ** Merged History Encounter **  ?    ? ?Social Determinants of Health  ? ?Financial Resource Strain: High Risk  ? Difficulty of Paying Living Expenses: Hard  ?Food Insecurity: Food Insecurity Present  ? Worried About Running Out of Food in the Last Year: Sometimes true  ? Ran Out of Food in the Last Year: Sometimes true  ?Transportation Needs: Unmet Transportation Needs  ? Lack of Transportation (Medical): Yes  ? Lack of Transportation (Non-Medical): Yes  ?Physical Activity: Not on file  ?Stress: Not on file  ?Social Connections: Not on file  ? ? ?Family History: ?Family History  ?Problem Relation Age of Onset  ? Other Mother   ?     died of covid 2022  ?   Other Father   ?     unknown hx  ? ? ?Allergies: ?No Known Allergies ? ?Pt denies allergies to latex, iodine, or shellfish. ? ?Medications Prior to Admission  ?Medication Sig Dispense Refill Last Dose  ? aspirin EC 81 MG tablet Take 1 tablet (81 mg total) by mouth daily. Swallow whole. 60 tablet 1   ? Blood Pressure Monitoring (BLOOD PRESSURE KIT) DEVI 1 Device by Does not apply route as needed. 1 each 0   ? ferrous sulfate 325 (65 FE) MG EC tablet Take 1 tablet (325 mg total) by mouth every other day. 30 tablet 2   ? Prenatal Vit-Fe Fumarate-FA (PRENATAL VITAMIN) 27-0.8 MG TABS Take 1 tablet by mouth daily. 30 tablet 11   ? ? ? ?Review of Systems  ? ?All systems reviewed and negative except as stated in HPI ? ?Blood pressure 134/74, pulse (!) 108, temperature 99 ?F (37.2 ?C), temperature source Oral, resp. rate 16, height 5' 3" (1.6 m), weight 106.7 kg, last menstrual period 09/13/2020, unknown if currently breastfeeding. ?General appearance:  alert, cooperative, and appears stated age ?Lungs: clear to auscultation bilaterally ?Heart: regular rate and rhythm ?Abdomen: soft, non-tender; bowel sounds normal ?Extremities: Homans sign is negative, no sign of DVT ?Presentation: cephalic ?Fetal monitoringBaseline: 150 bpm, Variability: Good {> 6 bpm), and Accelerations: Reactive ?Uterine activityNone ?  ? ? ?Prenatal labs: ?ABO, Rh: --/--/PENDING (04/27 0635) ?Antibody: PENDING (04/27 0635) ?Rubella: <0.90 (10/07 1104) ?RPR: Non Reactive (03/02 0909)  ?HBsAg: Negative (10/07 1104)  ?HIV: Non Reactive (03/02 0909)  ?GBS: Positive/-- (04/20 1602)  ?1 hr Glucola: Normal ?Genetic screening:  LR NIPS, Alpha thal and Sickle cell trait carrier  ?Anatomy US: Not obtained ? ?Prenatal Transfer Tool  ?Maternal Diabetes: No ?Genetic Screening: Normal ?Maternal Ultrasounds/Referrals: Normal ?Fetal Ultrasounds or other Referrals:  Fetal echo see care everywhere 05/24/2021  ?Maternal Substance Abuse:  No ?Significant Maternal Medications:  None ?Significant Maternal Lab Results: Group B Strep positive ? ?Results for orders placed or performed during the hospital encounter of 05/30/21 (from the past 24 hour(s))  ?Type and screen  ? Collection Time: 05/30/21  6:35 AM  ?Result Value Ref Range  ? ABO/RH(D) PENDING   ? Antibody Screen PENDING   ? Sample Expiration    ?  06/02/2021,2359 ?Performed at  [facility], [address] [place], [place] [address] ?  ? ? ?Patient Active Problem List  ? Diagnosis Date Noted  ? Fetal growth restriction antepartum 05/30/2021  ? Group B Streptococcus carrier, +RV culture, currently pregnant 05/27/2021  ? Need for follow-up by social worker 05/03/2021  ? Encounter for screening involving social determinants of health (SDoH) 05/03/2021  ? BMI 40.0-44.9, adult ([facility]) 03/11/2021  ? IUGR (intrauterine growth restriction) affecting care of mother 03/01/2021  ? 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 Alpha thalassemia trait 01/30/2021  ?  Supervision of high risk pregnancy, antepartum 01/15/2021  ? History of gestational hypertension 01/15/2021  ? Maternal morbid obesity, antepartum ([facility]) 04/12/2020  ? Late prenatal care 11/30/2019  ? Sickle cell trait ([facility]) 06/10/2019  ? Rubella non-immune status, antepartum 06/08/2019  ? 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 ? ?Assessment/Plan:  ?Anita Hayes is a 21 y.o. G2P1001 at [redacted]w[redacted]d here for IOL 2/2 FGR ? ?#Labor:Patient is currently resting comfortable. 0.5 cm dilated and thick. Received first dose of Cytotec and will reassess in 3-4 hours with plan to consider FB depending on progress. ?#Pain: IV PRN, Epidural ?#FWB: Cat 1  ?#ID:    GBS positive on PCN ?#MOF: Bottle ?#MOC: Undecided  ?#Circ:  Yes, needs consent  ? ?John Norbert, MD  ?[facility] ?05/30/2021, 7:04 AM ? ?Attestation of Supervision of Student:  I confirm that I have verified the information documented in the  resident's  note and that I have also personally reperformed the history, physical exam and all medical decision making activities.  I have verified that all services and findings are accurately documented in this student's note; and I agree with management and plan as outlined in the documentation. I have also made any necessary editorial changes. ? ?Savaughn Karwowski DNP, CNM  ?05/30/21  10:03 AM  ? ? ?  ?

## 2021-05-30 NOTE — Progress Notes (Signed)
Anita Hayes is a 21 y.o. G2P1001 at [redacted]w[redacted]d admitted for  IOL 2/2 FGR <1% ? ?Subjective: ?Patient up to bathroom and ambulated to bed with limited assistance. She states she is feeling her contractions and some pressure but they are tolerable. She consents to a sterile vag exam and foley balloon placement.  ? ?Objective: ?BP (!) 127/56   Pulse 91   Temp 98.6 ?F (37 ?C) (Oral)   Resp 16   Ht 5\' 3"  (1.6 m)   Wt 106.7 kg   LMP 09/13/2020 (Approximate)   BMI 41.66 kg/m?  ?No intake/output data recorded. ? ?FHR baseline 145 bpm, Variability: moderate, Accelerations:present, Decelerations:  Present  variable ?Toco: regular, every 1-3 minutes  ? ?SVE:   Dilation: 1.5 ?Effacement (%): Thick ?Station: -3 ?Exam by:: [email or web address], Travanti Mcmanus SNM ? ? ? ?Labs: ?Lab Results  ?Component Value Date  ? WBC 7.6 05/30/2021  ? HGB 11.2 (L) 05/30/2021  ? HCT 35.7 (L) 05/30/2021  ? MCV 72.7 (L) 05/30/2021  ? PLT 160 05/30/2021  ? ? ?Assessment / Plan: ?Hold dose of Cytotec. Attempted foley balloon insertion, no success. Will re-attempt placement in 2 hrs. ?Diet: laboring ?Pain management: Epidural if needed ? ?Labor: early ?Fetal Wellbeing:  Category II ?Pain Control:  n/a ?Pre-eclampsia: N/A ?I/D:  PCN for GBS+ ?Anticipated MOD: hopeful for vaginal birth ? ?06/01/2021 [name] ,SNM ?05/30/2021, 12:41 PM  ?

## 2021-05-30 NOTE — Progress Notes (Signed)
Patient ID: Anita Hayes, female   DOB: 08-21-2000, 21 y.o.   MRN: [id number] ?Doing well ?Comfortable with epidural ? ?Vitals:  ? 05/30/21 2045 05/30/21 2046 05/30/21 2050 05/30/21 2055  ?BP:  130/64    ?Pulse:  96    ?Resp:      ?Temp:      ?TempSrc:      ?SpO2: 100%  100% 100%  ?Weight:      ?Height:      ? ?FHR reactive ?UCs every 1.5-61min ? ?Will continue to observe ?

## 2021-05-30 NOTE — Progress Notes (Signed)
Patient ID: Anita Hayes, female   DOB: 26-Jan-2001, 21 y.o.   MRN: [id number] ? ?Patient is semi-fowlers. She states that her contractions are tolerable with IV pain medication. She consents to applied traction of cook balloon. ? ?BP 113/62, P 102 ?FHR 140, moderate variability, +acels, -decels ?Ctx: 2-4 minutes ? ?IUP 37wks ?FGR <1% (EFW 1961) ? ?Cook balloon. Low dose Pitocin 1x1. Pitocin at 13mu/min. ?Pain management: IV medication. Epidural if needed ?Category I Strip ?PCN for GBS + ?Anticipate SVD ? ?[place], SNM ?05/30/2021, [phone] ?

## 2021-05-31 ENCOUNTER — Encounter (HOSPITAL_COMMUNITY): Payer: Self-pay | Admitting: Family Medicine

## 2021-05-31 DIAGNOSIS — Z3A37 37 weeks gestation of pregnancy: Secondary | ICD-10-CM | POA: Diagnosis not present

## 2021-05-31 DIAGNOSIS — O365930 Maternal care for other known or suspected poor fetal growth, third trimester, not applicable or unspecified: Secondary | ICD-10-CM | POA: Diagnosis not present

## 2021-05-31 DIAGNOSIS — O135 Gestational [pregnancy-induced] hypertension without significant proteinuria, complicating the puerperium: Secondary | ICD-10-CM | POA: Diagnosis not present

## 2021-05-31 DIAGNOSIS — O99820 Streptococcus B carrier state complicating pregnancy: Secondary | ICD-10-CM | POA: Diagnosis not present

## 2021-05-31 MED ORDER — IBUPROFEN 600 MG PO TABS
600.0000 mg | ORAL_TABLET | Freq: Four times a day (QID) | ORAL | Status: DC
Start: 2021-05-31 — End: 2021-06-02
  Administered 2021-05-31 – 2021-06-02 (×8): 600 mg via ORAL
  Filled 2021-05-31 (×8): qty 1

## 2021-05-31 MED ORDER — ONDANSETRON HCL 4 MG PO TABS: 4.0000 mg | ORAL_TABLET | ORAL | Status: DC | PRN

## 2021-05-31 MED ORDER — PRENATAL MULTIVITAMIN CH
1.0000 | ORAL_TABLET | Freq: Every day | ORAL | Status: DC
  Administered 2021-05-31 – 2021-06-01 (×2): 1 via ORAL
  Filled 2021-05-31 (×2): qty 1

## 2021-05-31 MED ORDER — DIPHENHYDRAMINE HCL 25 MG PO CAPS: 25.0000 mg | ORAL_CAPSULE | Freq: Four times a day (QID) | ORAL | Status: DC | PRN

## 2021-05-31 MED ORDER — TETANUS-DIPHTH-ACELL PERTUSSIS 5-2.5-18.5 LF-MCG/0.5 IM SUSY: 0.5000 mL | PREFILLED_SYRINGE | Freq: Once | INTRAMUSCULAR | Status: DC

## 2021-05-31 MED ORDER — SIMETHICONE 80 MG PO CHEW
80.0000 mg | CHEWABLE_TABLET | ORAL | Status: DC | PRN
Start: 2021-05-31 — End: 2021-06-02

## 2021-05-31 MED ORDER — ZOLPIDEM TARTRATE 5 MG PO TABS: 5.0000 mg | ORAL_TABLET | Freq: Every evening | ORAL | Status: DC | PRN

## 2021-05-31 MED ORDER — ONDANSETRON HCL 4 MG/2ML IJ SOLN
4.0000 mg | INTRAMUSCULAR | Status: DC | PRN
Start: 2021-05-31 — End: 2021-06-02

## 2021-05-31 MED ORDER — BENZOCAINE-MENTHOL 20-0.5 % EX AERO
1.0000 | INHALATION_SPRAY | CUTANEOUS | Status: DC | PRN
Start: 2021-05-31 — End: 2021-06-02

## 2021-05-31 MED ORDER — ACETAMINOPHEN 325 MG PO TABS
650.0000 mg | ORAL_TABLET | ORAL | Status: DC | PRN
  Administered 2021-06-01: 650 mg via ORAL
  Filled 2021-05-31: qty 2

## 2021-05-31 MED ORDER — DIBUCAINE (PERIANAL) 1 % EX OINT
1.0000 | TOPICAL_OINTMENT | CUTANEOUS | Status: DC | PRN
Start: 2021-05-31 — End: 2021-06-02

## 2021-05-31 MED ORDER — WITCH HAZEL-GLYCERIN EX PADS: 1.0000 "application " | MEDICATED_PAD | CUTANEOUS | Status: DC | PRN

## 2021-05-31 MED ORDER — SENNOSIDES-DOCUSATE SODIUM 8.6-50 MG PO TABS
2.0000 | ORAL_TABLET | ORAL | Status: DC
Start: 2021-05-31 — End: 2021-06-02
  Administered 2021-05-31 – 2021-06-02 (×3): 2 via ORAL
  Filled 2021-05-31 (×3): qty 2

## 2021-05-31 MED ORDER — COCONUT OIL OIL: 1.0000 "application " | TOPICAL_OIL | Status: DC | PRN

## 2021-05-31 NOTE — Progress Notes (Signed)
Patient ID: Anita Hayes, female   DOB: 2000-12-27, 21 y.o.   MRN: [id number] ?Comfortable with epidural ?RN states pt is not willing to change positions ? ?Vitals:  ? 05/30/21 2331 05/31/21 0001 05/31/21 0031 05/31/21 0101  ?BP: (!) 122/49 (!) 122/57 (!) 146/53 130/61  ?Pulse: 85 70 76 77  ?Resp:      ?Temp:      ?TempSrc:      ?SpO2:      ?Weight:      ?Height:      ? ?FHR reactive ?UCs q42min  ? ?Dilation: 5 ?Effacement (%): 50 ?Cervical Position: Middle ?Station: -3 ?Presentation: Vertex ?Exam by:: Ulis Rias, RN ? ? ?AROM clear fluid ?IUPC inserted. ?

## 2021-05-31 NOTE — Anesthesia Postprocedure Evaluation (Signed)
Anesthesia Post Note ? ?Patient: Anita Hayes ? ?Procedure(s) Performed: AN AD HOC LABOR EPIDURAL ? ?  ? ?Patient location during evaluation: Mother Baby ?Anesthesia Type: Epidural ?Level of consciousness: awake and alert and oriented ?Pain management: satisfactory to patient ?Vital Signs Assessment: post-procedure vital signs reviewed and stable ?Respiratory status: respiratory function stable ?Cardiovascular status: stable ?Postop Assessment: no headache, no backache, epidural receding, patient able to bend at knees, no signs of nausea or vomiting, adequate PO intake and able to ambulate ?Anesthetic complications: no ? ? ?No notable events documented. ? ?Last Vitals:  ?Vitals:  ? 05/31/21 0802 05/31/21 1144  ?BP: 128/69 (!) 133/53  ?Pulse: 97 82  ?Resp: 18 18  ?Temp: 36.9 ?C 36.6 ?C  ?SpO2: 97% 100%  ?  ?Last Pain:  ?Vitals:  ? 05/31/21 1144  ?TempSrc: Oral  ?PainSc: 0-No pain  ? ?Pain Goal:   ? ?  ?  ?  ?  ?  ?  ?  ? ?[name] ? ? ? ? ?

## 2021-05-31 NOTE — Discharge Summary (Signed)
? ?  Postpartum Discharge Summary ? ?Patient Name: Anita Hayes ?DOB: 09-Apr-2000 ?MRN: [id number] ? ?Date of admission: 05/30/2021 ?Delivery date:05/31/2021  ?Delivering provider: Hansel Feinstein L  ?Date of discharge: 06/02/2021 ? ?Admitting diagnosis: Fetal growth restriction antepartum [O36.5990] ?Intrauterine pregnancy: [redacted]w[redacted]d     ?Secondary diagnosis:  Principal Problem: ?  Fetal growth restriction antepartum ?Active Problems: ?  Vaginal delivery ?  Postpartum care following vaginal delivery ? ?Additional problems: bleeding with delivery, ? abruption    ?Discharge diagnosis: Term Pregnancy Delivered and IUGR                                               ?Post partum procedures: n/a ?Augmentation: AROM, Pitocin, and IP Foley ?Complications: None ? ?Hospital course: Induction of Labor With Vaginal Delivery   ?21 y.o. yo G2P1001 at [redacted]w[redacted]d was admitted to the hospital 05/30/2021 for induction of labor.  Indication for induction:  IUGR .  Patient had an uncomplicated labor course as follows: ?Membrane Rupture Time/Date: 2:50 AM ,05/31/2021   ?Delivery Method:Vaginal, Spontaneous  ?Episiotomy: None  ?Lacerations:  None  ?Details of delivery can be found in separate delivery note.  Patient had a routine postpartum course. Patient is discharged home 06/02/21. ? ?Newborn Data: ?Birth date:05/31/2021  ?Birth time:4:42 AM  ?Gender:Female  ?Living status:Living  ?Apgars:8 ,9  ?Weight:2155 g  ? ?Magnesium Sulfate received: No ?BMZ received: No ?Rhophylac:N/A ?MMR:Yes ?T-DaP:Given prenatally ?Flu: No ?Transfusion:No ? ?Physical exam  ?Vitals:  ? 06/01/21 1503 06/01/21 2100 06/01/21 2209 06/02/21 0543  ?BP: 125/64 138/60 128/62 135/79  ?Pulse: 79 83    ?Resp: $Remov'17 18  16  'WLGvDN$ ?Temp: 99.1 ?F (37.3 ?C) 98.6 ?F (37 ?C)  97.9 ?F (36.6 ?C)  ?TempSrc: Oral Oral  Oral  ?SpO2: 100% 100%  100%  ?Weight:      ?Height:      ? ?General: alert, cooperative, and no distress ?Lochia: appropriate ?Uterine Fundus: firm ?Incision: N/A ?DVT Evaluation: No  evidence of DVT seen on physical exam. ?Labs: ?Lab Results  ?Component Value Date  ? WBC 7.6 05/30/2021  ? HGB 11.2 (L) 05/30/2021  ? HCT 35.7 (L) 05/30/2021  ? MCV 72.7 (L) 05/30/2021  ? PLT 160 05/30/2021  ? ? ?  Latest Ref Rng & Units 11/30/2019  ?  7:43 AM  ?CMP  ?Glucose 70 - 99 mg/dL 93    ?BUN 6 - 20 mg/dL <5    ?Creatinine 0.44 - 1.00 mg/dL 0.42    ?Sodium 135 - 145 mmol/L 137    ?Potassium 3.5 - 5.1 mmol/L 2.9    ?Chloride 98 - 111 mmol/L 108    ?CO2 22 - 32 mmol/L 21    ?Calcium 8.9 - 10.3 mg/dL 8.6    ?Total Protein 6.5 - 8.1 g/dL 6.0    ?Total Bilirubin 0.3 - 1.2 mg/dL 0.5    ?Alkaline Phos 38 - 126 U/L 78    ?AST 15 - 41 U/L 22    ?ALT 0 - 44 U/L 13    ? ?Edinburgh Score: ? ?  05/31/2021  ?  8:02 AM  ?Flavia Shipper Postnatal Depression Scale Screening Tool  ?I have been able to laugh and see the funny side of things. 0  ?I have looked forward with enjoyment to things. 0  ?I have blamed myself unnecessarily when things went wrong. 0  ?I have been anxious  or worried for no good reason. 0  ?I have felt scared or panicky for no good reason. 0  ?Things have been getting on top of me. 0  ?I have been so unhappy that I have had difficulty sleeping. 0  ?I have felt sad or miserable. 0  ?I have been so unhappy that I have been crying. 0  ?The thought of harming myself has occurred to me. 0  ?Edinburgh Postnatal Depression Scale Total 0  ? ? ? ? ?After visit meds:  ?Allergies as of 06/02/2021   ?No Known Allergies ?  ? ?  ?Medication List  ?  ? ?STOP taking these medications   ? ?aspirin EC 81 MG tablet ?  ? ?  ? ?TAKE these medications   ? ?Blood Pressure Kit Devi ?1 Device by Does not apply route as needed. ?  ?ferrous sulfate 325 (65 FE) MG EC tablet ?Take 1 tablet (325 mg total) by mouth every other day. ?What changed: when to take this ?  ?ibuprofen 600 MG tablet ?Commonly known as: ADVIL ?Take 1 tablet (600 mg total) by mouth every 6 (six) hours. ?  ?NIFEdipine 30 MG 24 hr tablet ?Commonly known as: ADALAT  CC ?Take 1 tablet (30 mg total) by mouth daily. ?  ?Prenatal Vitamin 27-0.8 MG Tabs ?Take 1 tablet by mouth daily. ?  ? ?  ? ? ? ?Discharge home in stable condition ?Infant Feeding: Bottle ?Infant Disposition:home with mother ?Discharge instruction: per After Visit Summary and Postpartum booklet. ?Activity: Advance as tolerated. Pelvic rest for 6 weeks.  ?Diet: routine diet ?Anticipated Birth Control: OCPs ?Postpartum Appointment:1 week ?Additional Postpartum F/U: BP check 1 week ?Future Appointments: ?Future Appointments  ?Date Time Provider [place]  ?06/21/2021  1:20 PM Tobb, Godfrey Pick, DO [facility] None  ?07/11/2021  8:55 AM Ardean Larsen, Mervyn Skeeters, CNM [facility]  ? ?Follow up Visit: ? Follow-up Information   ? ? [facility].   ?Specialty: Obstetrics and Gynecology ?Why: In 1 week for a BP check and, In 4 weeks for a postpartum appt ?Contact information: ?[place] [address] ?[place] [address] ?[phone] ? ?  ?  ? ?  ?  ? ?  ? ? ?BPs remained in upper 120-130s postpartum. Started on Procardia 30XL and message sent to [facility] to schedule for BP check this week.  ?  ? ?06/02/2021 ?Wende Mott, CNM ? ? ?

## 2021-06-01 ENCOUNTER — Encounter (HOSPITAL_COMMUNITY): Payer: Self-pay | Admitting: Family Medicine

## 2021-06-01 MED ORDER — MEASLES, MUMPS & RUBELLA VAC IJ SOLR
0.5000 mL | Freq: Once | INTRAMUSCULAR | Status: AC
  Administered 2021-06-02: 0.5 mL via SUBCUTANEOUS
  Filled 2021-06-01: qty 0.5

## 2021-06-01 NOTE — Progress Notes (Addendum)
Post Partum Day 1 ?Subjective: ?Patient is right lateral, no complaints and tolerating PO. Would like to stay until PPD#2 ? ?Objective: ?Blood pressure 130/66, pulse 88, temperature 98.8 ?F (37.1 ?C), temperature source Oral, resp. rate 17, height _0  (1.6 m), weight 106.7 kg, last menstrual period 09/13/2020, SpO2 99 %, unknown if currently breastfeeding. ? ?Physical Exam:  ?General: alert and cooperative ?Lochia: appropriate ?Uterine Fundus: firm ?Incision: N/A ?DVT Evaluation: No evidence of DVT seen on physical exam. ? ?Recent Labs  ?  05/30/21 ?0704  ?HGB 11.2*  ?HCT 35.7*  ? ? ?Assessment/Plan: ?Plan for discharge tomorrow and Circumcision prior to discharge ?Discussed MMR vaccine with patient and nurse will provide handout.  ? ? LOS: 2 days  ? ?Coffman C[place], SNM ?06/01/2021, 7:50 AM  ? ? ?GME ATTESTATION:  ?I saw and evaluated the patient. I agree with the findings and the plan of care as documented in the student's note and made all necessary edits. Consented patient for baby's circumcision during my visit with her. ? ?Renard Matter, MD, MPH ?OB Fellow, Faculty Practice ?[facility] ?06/01/2021 1:42 PM ? ? ?

## 2021-06-01 NOTE — Clinical Social Work Maternal (Signed)
?  CLINICAL SOCIAL WORK MATERNAL/CHILD NOTE ? ?Patient Details  ?Name: Anita Hayes ?MRN: [id number] ?Date of Birth: 05-08-2000 ? ?Date:  06/01/2021 ? ?Clinical Social Worker Initiating Note:  Jimmy Picket, [place] Date/Time: Initiated:  06/01/21/1130    ? ?Child's Name:  Anita Hayes  ? ?Biological Parents:  Mother  ? ?Need for Interpreter:  None  ? ?Reason for Referral:  Other (Comment), Late or No Prenatal Care    ? ?Address:  [address] ?[place] [address]  ?  ?Phone number:  [phone] (home)    ? ?Additional phone number:  ? ?Household Members/Support Persons (HM/SP):   Household Member/Support Person 1, Household Member/Support Person 2 ? ? ?HM/SP Name Relationship DOB or Age  ?HM/SP -1 [name] cousin    ?HM/SP -2 [name] son 1  ?HM/SP -3        ?HM/SP -4        ?HM/SP -5        ?HM/SP -6        ?HM/SP -7        ?HM/SP -8        ? ? ?Natural Supports (not living in the home):  Spouse/significant other, Extended Family  ? ?Professional Supports:    ? ?Employment: Unemployed  ? ?Type of Work:    ? ?Education:  9 to 11 years  ? ?Homebound arranged:   ? ?Financial Resources:  Medicaid  ? ?Other Resources:  WIC, Food Stamps    ? ?Cultural/Religious Considerations Which May Impact Care:   ? ?Strengths:  Ability to meet basic needs  , Pediatrician chosen  ? ?Psychotropic Medications:        ? ?Pediatrician:    [place] area ? ?Pediatrician List:  ? ?[facility]  ?[place]    ?[facility]    ?[facility]    ?[facility]    ?[facility]    ? ? ?Pediatrician Fax Number:   ? ?Risk Factors/Current Problems:  None  ? ?Cognitive State:  Alert    ? ?Mood/Affect:  Calm    ? ?CSW Assessment: CSW spoke to [facility]. MOB reports she lives at home with her cousin Debbora Lacrosse and her son. MOB reports she has food and receives foods tamps and WIC. MOB reports stable housing. MOB reports home is ready for baby. MOB has car seat and bassinet at home. MOB reports  having transportation to appointments. MOB reports her mother died in 2019/06/04. MOB states she is adjusting, declined grief resources. MOB reports having the support of her cousin and her Childrens paternal Grandmother. ? ?CSW provided education regarding the baby blues period vs. perinatal mood disorders, discussed treatment and gave resources for mental health follow up if concerns arise.  CSW recommends self-evaluation during the postpartum time period using the New Mom Checklist from Postpartum Progress and encouraged MOB to contact a medical professional if symptoms are noted at any time.   ? ?CSW provided review of Sudden Infant Death Syndrome (SIDS) precautions.   ?CSW identifies no further need for intervention and no barriers to discharge at this time.  ? ?CSW Plan/Description:  No Further Intervention Required/No Barriers to Discharge, Sudden Infant Death Syndrome (SIDS) Education, Perinatal Mood and Anxiety Disorder (PMADs) Education  ? ? ?Jimmy Picket, LCSW ?06/01/2021, 11:33 AM ? ?

## 2021-06-02 MED ORDER — NIFEDIPINE ER OSMOTIC RELEASE 30 MG PO TB24
30.0000 mg | ORAL_TABLET | Freq: Every day | ORAL | Status: DC
  Administered 2021-06-02: 30 mg via ORAL
  Filled 2021-06-02: qty 1

## 2021-06-02 MED ORDER — IBUPROFEN 600 MG PO TABS
600.0000 mg | ORAL_TABLET | Freq: Four times a day (QID) | ORAL | 0 refills | Status: DC
Start: 2021-06-02 — End: 2021-08-30

## 2021-06-02 MED ORDER — NIFEDIPINE ER 30 MG PO TB24: 30.0000 mg | ORAL_TABLET | Freq: Every day | ORAL | 1 refills | Status: DC

## 2021-06-03 ENCOUNTER — Telehealth: Payer: Self-pay | Admitting: Family Medicine

## 2021-06-03 LAB — SURGICAL PATHOLOGY

## 2021-06-03 NOTE — Telephone Encounter (Signed)
Called patient to schedule nurse visit, there was no answer to the phone call so a voicemail was left with the call back number for the office and a letter was mailed.  ?

## 2021-06-04 ENCOUNTER — Encounter: Payer: Medicaid Other | Admitting: Obstetrics and Gynecology

## 2021-06-05 ENCOUNTER — Telehealth: Payer: Self-pay | Admitting: General Practice

## 2021-06-05 NOTE — Telephone Encounter (Signed)
Attempted to call patient concerning partner's horizon results, no answer- left message to call us back. Also attempted to reach patient's partner with his test results.  ?

## 2021-06-08 ENCOUNTER — Telehealth (HOSPITAL_COMMUNITY): Payer: Self-pay | Admitting: *Deleted

## 2021-06-08 NOTE — Telephone Encounter (Signed)
Patient called RN as Charity fundraiser was leaving a Engineer, technical sales. Patient voiced no questions or concerns at this time regarding her own health. EPDS=0. Patient voiced no questions or concerns regarding infant at this time. Patient reports infant sleeps in a bassinet on his back. RN reviewed ABCs of safe sleep. Patient verbalized understanding. Patient requested RN email information on hospital's virtual Baby and Me class. Email sent. Deforest Hoyles, RN, 06/08/21, 1103  ?

## 2021-06-10 ENCOUNTER — Ambulatory Visit: Payer: Medicaid Other

## 2021-06-20 ENCOUNTER — Telehealth: Payer: Self-pay

## 2021-06-20 NOTE — Telephone Encounter (Signed)
Called pt to confirm apportionment for tomorrow. No answer. Left message for her to return the call.

## 2021-06-21 ENCOUNTER — Ambulatory Visit: Payer: Medicaid Other | Admitting: Cardiology

## 2021-07-09 ENCOUNTER — Encounter: Payer: Self-pay | Admitting: *Deleted

## 2021-07-10 ENCOUNTER — Encounter: Payer: Self-pay | Admitting: Family Medicine

## 2021-07-10 NOTE — Progress Notes (Deleted)
ERROR

## 2021-07-11 ENCOUNTER — Telehealth: Payer: Self-pay

## 2021-07-11 NOTE — Progress Notes (Signed)
6063-- Called patient, no answer- left message stating we are calling to start her visit, please get on mychart video to get started  0926-- Called patient and someone picked up the phone but could never talk to a person. Patient's appt will be rescheduled.

## 2021-07-11 NOTE — Telephone Encounter (Signed)
Called patient and asked if she would like to start virtual visit early. She declined stating "no, not yet".  I informed her that I will reach out to her once its time for her appointment.  Shakur Lembo, CMA   8:29am

## 2021-07-31 ENCOUNTER — Other Ambulatory Visit: Payer: Self-pay

## 2021-07-31 ENCOUNTER — Telehealth: Payer: Self-pay | Admitting: *Deleted

## 2021-07-31 ENCOUNTER — Other Ambulatory Visit: Payer: Self-pay | Admitting: Family Medicine

## 2021-07-31 DIAGNOSIS — F53 Postpartum depression: Secondary | ICD-10-CM

## 2021-07-31 NOTE — Telephone Encounter (Signed)
   Telephone encounter was:  Unsuccessful.  07/31/2021 Name: Janki Dike MRN: [id number] DOB: [birth year]  Unsuccessful outbound call made today to assist with:   SDOH  Outreach Attempt:  1st Attempt  A HIPAA compliant voice message was left requesting a return call.  Instructed patient to call back at   Instructed patient to call back at [phone]  at their earliest convenience. Yehuda Mao Greenauer -[facility] Guide , Embedded Care Coordination [facility], Care Management  [phone] [address] [place] , [place] [address] Email : [email or web address]

## 2021-08-01 ENCOUNTER — Telehealth: Payer: Self-pay | Admitting: *Deleted

## 2021-08-01 NOTE — Telephone Encounter (Signed)
   Telephone encounter was:  Unsuccessful.  08/01/2021 Name: Anita Hayes MRN: [id number] DOB: May 31, 2000  Unsuccessful outbound call made today to assist with:  Food Insecurity  Outreach Attempt:  2nd Attempt  A HIPAA compliant voice message was left requesting a return call.  Instructed patient to call back at   Instructed patient to call back at [phone]  at their earliest convenience. Yehuda Mao Greenauer -[facility] Guide , Embedded Care Coordination [facility], Care Management  [phone] [address] [place] , [place] [address] Email : [email or web address]

## 2021-08-02 ENCOUNTER — Telehealth: Payer: Self-pay | Admitting: Family Medicine

## 2021-08-02 NOTE — Telephone Encounter (Signed)
..   Medicaid Managed Care   Unsuccessful Outreach Note  08/02/2021 Name: Anita Hayes MRN: [id number] DOB: 25-Feb-2000  Referred by: Evelena Leyden, DO Reason for referral : High Risk Managed Medicaid (I called the patient today to get her scheduled with the MM LCSW. I left my name and number on her VM.)   An unsuccessful telephone outreach was attempted today. The patient was referred to the case management team for assistance with care management and care coordination.   Follow Up Plan: The care management team will reach out to the patient again over the next 7 days.    Weston Settle Care Guide, High Risk Medicaid Managed Care Embedded Care Coordination [facility]  [facility]    SIGNATURE

## 2021-08-05 ENCOUNTER — Telehealth: Payer: Self-pay | Admitting: Family Medicine

## 2021-08-05 NOTE — Telephone Encounter (Signed)
..   Medicaid Managed Care   Unsuccessful Outreach Note  08/05/2021 Name: Anita Hayes MRN: [id number] DOB: Apr 13, 2000  Referred by: Evelena Leyden, DO Reason for referral : High Risk Managed Medicaid (I called the patient today to get her scheduled for a phone visit with the MM LCSW. I left my name and number on her VM. This was my 2nd attempt to reach her.)   A second unsuccessful telephone outreach was attempted today. The patient was referred to the case management team for assistance with care management and care coordination.   Follow Up Plan: The care management team will reach out to the patient again over the next 7 days.    Weston Settle Care Guide, High Risk Medicaid Managed Care Embedded Care Coordination [facility]  [facility]    SIGNATURE

## 2021-08-07 ENCOUNTER — Ambulatory Visit: Payer: Medicaid Other | Admitting: Family Medicine

## 2021-08-07 ENCOUNTER — Telehealth: Payer: Self-pay | Admitting: Family Medicine

## 2021-08-07 NOTE — Telephone Encounter (Signed)
..   Medicaid Managed Care   Unsuccessful Outreach Note  08/07/2021 Name: Anita Hayes MRN: [id number] DOB: 02-19-2000  Referred by: Evelena Leyden, DO Reason for referral : High Risk Managed Medicaid (I called the patient today to get her scheduled with the MM LCSW. I left my name and number on her VM. This was my third attempt to reach the patient.)   Third unsuccessful telephone outreach was attempted today. The patient was referred to the case management team for assistance with care management and care coordination. The patient's primary care provider has been notified of our unsuccessful attempts to make or maintain contact with the patient. The care management team is pleased to engage with this patient at any time in the future should he/she be interested in assistance from the care management team.   Follow Up Plan: We have been unable to make contact with the patient for follow up. The care management team is available to follow up with the patient after provider conversation with the patient regarding recommendation for care management engagement and subsequent re-referral to the care management team.    Weston Settle Care Guide, High Risk Medicaid Managed Care Embedded Care Coordination [facility]  [facility]    SIGNATURE

## 2021-08-08 ENCOUNTER — Telehealth: Payer: Self-pay | Admitting: *Deleted

## 2021-08-08 NOTE — Telephone Encounter (Signed)
   Telephone encounter was:  Unsuccessful.  08/08/2021 Name: Anita Hayes MRN: [id number] DOB: [birth year]  Unsuccessful outbound call made today to assist with:  Food Insecurity  Patient has been outreached 3x from CG with messages left no response CG will close the case if patient needs further assistance please place another referral   Outreach Attempt:  3rd Attempt.  Referral closed unable to contact patient.  A HIPAA compliant voice message was left requesting a return call.  Instructed patient to call back at   Instructed patient to call back at [phone]  at their earliest convenience.     Alois Cliche -[facility] Guide , Embedded Care Coordination [facility], Care Management  [phone] [address] [place] , [place] [address] Email : [email or web address]  .

## 2021-08-13 ENCOUNTER — Telehealth: Payer: Self-pay | Admitting: Family Medicine

## 2021-08-13 DIAGNOSIS — F53 Postpartum depression: Secondary | ICD-10-CM

## 2021-08-13 NOTE — Telephone Encounter (Signed)
Was discussed with Dr. Christell Constant during pediatric visit for the patient's child. Patient has expressed concerns with possible post partum depression and is needing further assistance. Confirmed with patient that she was requesting this referral, and it has now been placed.    Armonte Tortorella, DO

## 2021-08-21 ENCOUNTER — Ambulatory Visit (INDEPENDENT_AMBULATORY_CARE_PROVIDER_SITE_OTHER): Payer: Medicaid Other | Admitting: Family Medicine

## 2021-08-21 ENCOUNTER — Encounter: Payer: Self-pay | Admitting: Family Medicine

## 2021-08-21 VITALS — BP 111/68 | HR 69 | Ht 63.0 in | Wt 224.4 lb

## 2021-08-21 DIAGNOSIS — Z30016 Encounter for initial prescription of transdermal patch hormonal contraceptive device: Secondary | ICD-10-CM | POA: Diagnosis not present

## 2021-08-21 DIAGNOSIS — F53 Postpartum depression: Secondary | ICD-10-CM | POA: Diagnosis present

## 2021-08-21 MED ORDER — NORELGESTROMIN-ETH ESTRADIOL 150-35 MCG/24HR TD PTWK
1.0000 | MEDICATED_PATCH | TRANSDERMAL | 11 refills | Status: DC
Start: 2021-08-21 — End: 2022-06-23

## 2021-08-21 MED ORDER — SERTRALINE HCL 50 MG PO TABS: 50.0000 mg | ORAL_TABLET | Freq: Every day | ORAL | 1 refills | Status: DC

## 2021-08-21 NOTE — Progress Notes (Signed)
    SUBJECTIVE:   CHIEF COMPLAINT / HPI:   Postpartum Visit -[facility] -No-showed her postpartum appt due to transportation issues and had a lot going on -Had uncomplicated vaginal delivery at [redacted]w[redacted]d (on 05/31/21- is now almost 3 months postpartum) -BP in the upper 120s-130s systolic postpartum, otherwise unremarkable postpartum course in the hospital -Today reports she's doing fine overall -Mainly wants to discuss contraception- desires patch -Also endorses some postpartum depression. Had reached out via MyChart and was given resources but wishes to discuss further  -Reports her Mom died in 05-13-19 and her brother died recently -Identifies this as the main source of her depressed mood -Feels she's doing better than before but is interested in medication to help with mood -Denies SI or HI -No prior history of mental health concerns -FOB and his Mom are both good support system  PERTINENT  PMH / PSH: gHTN, sickle cell trait, pulmonary artery stenosis  OBJECTIVE:   BP 111/68   Pulse 69   Ht 5\' 3"  (1.6 m)   Wt 224 lb 6.4 oz (101.8 kg)   LMP 09/13/2020 (Approximate)   SpO2 100%   Breastfeeding No   BMI 39.75 kg/m   Gen: NAD, pleasant, able to participate in exam CV: RRR, normal S1/S2, no murmur Resp: Normal effort, lungs CTAB Extremities: no edema or cyanosis Skin: warm and dry, no rashes noted Neuro: alert, no obvious focal deficits Psych: flat affect, normal thought content, good insight, normal speech  ASSESSMENT/PLAN:   Depressed mood with postpartum onset Edinburgh score of 2 today and PHQ-9 score of 0, however patient endorses concern regarding depressed mood and is interested in both medication and counseling. No SI/HI. Negative screening for mania. Start zoloft 50mg  daily. Return in 2-3 weeks for check-in. Given [facility] therapy resources and crisis info in AVS.  Encounter for initial prescription of transdermal patch hormonal  contraceptive device Patient desires to use patch for contraception. She denies personal history of VTE, stroke or heart attack.  Denies personal h/o breast cancer.  Pt is either a non-smoker or smoker under the age of 21yo.  Denies h/o migraines with aura. H/o gHTN noted but no chronic HTN, BP well-controlled today. H/o pulmonary stenosis not a contraindication to patch use. -Rx sent for Xulane patch -Counseled on proper use. Advised to use back up contraception for at least 2 weeks   , MD [facility]

## 2021-08-21 NOTE — Patient Instructions (Addendum)
It was great to meet you!  I have sent the birth control patch to your pharmacy. Read the instructions in the box. Place 1 patch weekly. Use back up contraception (condoms) for at least 2 weeks.  I have also sent medication to help with your mood. Take 1 pill daily. If you notice any adverse effects, please let us know.  If you're ever in crisis, go to the nearest emergency department, the behavioral health center (info below), or call the suicide hotline (988).  [facility] [address]  [place], [place] [address] [phone]  For urgent therapy (not medication) Walk in hours are 8-1pm Monday through Wednesday (please come at 7/730 to ensure you are seen)   Come back to see me in 2 weeks to check-in.  Take care, Dr Anner Crete

## 2021-08-22 DIAGNOSIS — F53 Postpartum depression: Secondary | ICD-10-CM | POA: Insufficient documentation

## 2021-08-22 DIAGNOSIS — Z30016 Encounter for initial prescription of transdermal patch hormonal contraceptive device: Secondary | ICD-10-CM | POA: Insufficient documentation

## 2021-08-22 HISTORY — DX: Postpartum depression: F53.0

## 2021-08-22 HISTORY — DX: Encounter for initial prescription of transdermal patch hormonal contraceptive device: Z30.016

## 2021-08-22 NOTE — Assessment & Plan Note (Addendum)
Patient desires to use patch for contraception. She denies personal history of VTE, stroke or heart attack.  Denies personal h/o breast cancer.  Pt is either a non-smoker or smoker under the age of 21yo.  Denies h/o migraines with aura. H/o gHTN noted but no chronic HTN, BP well-controlled today. H/o pulmonary stenosis not a contraindication to patch use. -Rx sent for Xulane patch -Counseled on proper use. Advised to use back up contraception for at least 2 weeks

## 2021-08-22 NOTE — Assessment & Plan Note (Addendum)
Edinburgh score of 2 today and PHQ-9 score of 0, however patient endorses concern regarding depressed mood and is interested in both medication and counseling. No SI/HI. Negative screening for mania. Start zoloft 50mg  daily. Return in 2-3 weeks for check-in. Given [facility] therapy resources and crisis info in AVS.

## 2021-08-30 ENCOUNTER — Ambulatory Visit: Payer: Medicaid Other | Admitting: Cardiology

## 2021-08-30 ENCOUNTER — Encounter: Payer: Self-pay | Admitting: Cardiology

## 2021-08-30 ENCOUNTER — Ambulatory Visit (INDEPENDENT_AMBULATORY_CARE_PROVIDER_SITE_OTHER): Payer: Medicaid Other | Admitting: Cardiology

## 2021-08-30 VITALS — BP 118/54 | HR 69 | Ht 63.0 in | Wt 215.2 lb

## 2021-08-30 DIAGNOSIS — Z8759 Personal history of other complications of pregnancy, childbirth and the puerperium: Secondary | ICD-10-CM

## 2021-08-30 NOTE — Patient Instructions (Signed)
Medication Instructions:  Your physician recommends that you continue on your current medications as directed. Please refer to the Current Medication list given to you today.  *If you need a refill on your cardiac medications before your next appointment, please call your pharmacy*   Lab Work: NONE If you have labs (blood work) drawn today and your tests are completely normal, you will receive your results only by: MyChart Message (if you have MyChart) OR A paper copy in the mail If you have any lab test that is abnormal or we need to change your treatment, we will call you to review the results.   Testing/Procedures: NONE   Follow-Up: At [facility], you and your health needs are our priority.  As part of our continuing mission to provide you with exceptional heart care, we have created designated Provider Care Teams.  These Care Teams include your primary Cardiologist (physician) and Advanced Practice Providers (APPs -  Physician Assistants and Nurse Practitioners) who all work together to provide you with the care you need, when you need it.  We recommend signing up for the patient portal called "MyChart".  Sign up information is provided on this After Visit Summary.  MyChart is used to connect with patients for Virtual Visits (Telemedicine).  Patients are able to view lab/test results, encounter notes, upcoming appointments, etc.  Non-urgent messages can be sent to your provider as well.   To learn more about what you can do with MyChart, go to [email or web address].    Your next appointment:   As Needed    Provider:   Thomasene Ripple, DO

## 2021-08-30 NOTE — Progress Notes (Signed)
Cardio-Obstetrics Clinic  Follow Up Note   Date:  09/06/2021   ID:  Anita Hayes, DOB 2001/01/17, MRN [id number]  PCP:  Evelena Leyden, DO   [facility] Providers Cardiologist:  Thomasene Ripple, DO  Electrophysiologist:  None        Referring MD: Thomasene Ripple, DO   Chief Complaint: "' I am doing ok"  History of Present Illness:    Anita Hayes is a 21 y.o. female [G2P2002] who returns for postpartum cardiovascular visit.  At her last visit her blood pressure was at goal.  She was having some social issues which would refer her to the Child psychotherapist as well.  Since I saw the patient she has delivered her baby.  She is having some social issues because she just lost her brother also gun violence.  The funeral is on Monday.  She has not been able to start her therapy due to child care.  Prior CV Studies Reviewed: The following studies were reviewed today:    TTE 11/16/2019 IMPRESSIONS   1. Left ventricular ejection fraction, by estimation, is 60 to 65%. The left ventricle has normal function. The left ventricle has no regional  wall motion abnormalities. Left ventricular diastolic parameters were normal.   2. Right ventricular systolic function is normal. The right ventricular size is normal.   3. The mitral valve is normal in structure. No evidence of mitral valve regurgitation. No evidence of mitral stenosis.   4. The aortic valve is normal in structure. Aortic valve regurgitation is not visualized.   5. The pulmonic valve, pulmonary truck and the proximal right and left pulmonary arteries appear normal . I do not see any evidence of pulmonary  artery stenosis.   FINDINGS   Left Ventricle: Left ventricular ejection fraction, by estimation, is 60  to 65%. The left ventricle has normal function. The left ventricle has no  regional wall motion abnormalities. The left ventricular internal cavity  size was normal in size. There is   no left ventricular hypertrophy. Left  ventricular diastolic parameters  were normal. The ratio of pulmonic flow to systemic flow (Qp/Qs ratio) is  1.80.   Right Ventricle: The right ventricular size is normal. No increase in  right ventricular wall thickness. Right ventricular systolic function is  normal.   Left Atrium: Left atrial size was normal in size.   Right Atrium: Right atrial size was normal in size.   Pericardium: There is no evidence of pericardial effusion.   Mitral Valve: The mitral valve is normal in structure. No evidence of  mitral valve regurgitation. No evidence of mitral valve stenosis.   Tricuspid Valve: The tricuspid valve is normal in structure. Tricuspid  valve regurgitation is not demonstrated. No evidence of tricuspid  stenosis.   Aortic Valve: The aortic valve is normal in structure. Aortic valve  regurgitation is not visualized.   Pulmonic Valve: The pulmonic valve was normal in structure. Pulmonic valve  regurgitation is trivial. No evidence of pulmonic stenosis.   Aorta: The aortic root and ascending aorta are structurally normal, with  no evidence of dilitation.   Pulmonary Artery: The pulmonic valve, pulmonary truck and the proximal  right and left pulmonary arteries appear normal . I do not see any  evidence of pulmonary artery stenosis.   IAS/Shunts: The atrial septum is grossly normal. The ratio of pulmonic  flow to systemic flow (Qp/Qs ratio) is 1.80.   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        Past Surgical History:  Procedure Laterality Date   ADENOIDECTOMY     TONSILLECTOMY AND ADENOIDECTOMY        OB History     Gravida  2   Para  2   Term  2   Preterm      AB      Living  2      SAB      IAB      Ectopic      Multiple  0   Live Births  2               Current Medications: Current Meds  Medication Sig   norelgestromin-ethinyl estradiol 11/16) 150-35 MCG/24HR transdermal patch Place 1 patch onto the skin once a week.   sertraline (ZOLOFT) 50 MG tablet Take 1 tablet (50 mg total) by mouth daily.     Allergies:   Patient has no known allergies.   Social History   Socioeconomic History   Marital status: Single    Spouse name: Not on file   Number of children: Not on file   Years of education: Not on file   Highest education level: Not on file  Occupational History   Not on file  Tobacco Use   Smoking status: Never   Smokeless tobacco: Never  Vaping Use   Vaping Use: Never used  Substance and Sexual Activity   Alcohol use: Never   Drug use: Never   Sexual activity: Not Currently  Other Topics Concern   Not on file  Social History Narrative   ** Merged History Encounter **       Social Determinants of Health   Financial Resource Strain: High Risk (05/06/2021)   Overall Financial Resource Strain (CARDIA)    Difficulty of Paying Living Expenses: Hard  Food Insecurity: Food Insecurity Present (05/06/2021)   Hunger Vital Sign    Worried About Running Out of Food in the Last Year: Sometimes true    Ran Out of Food in the Last Year: Sometimes true  Transportation Needs: Unmet Transportation Needs (05/06/2021)   PRAPARE - 07/06/2021 (Medical): Yes    Lack of Transportation (Non-Medical): Yes  Physical Activity: Not on file  Stress: Not on file  Social Connections: Not on  file      Family History  Problem Relation Age of Onset   Other Mother        died of covid May 26, 2020   Other Father        unknown hx      ROS:   Please see the history of present illness.     All other systems reviewed and are negative.   Labs/EKG Reviewed:    EKG:   EKG is was not ordered today.    Recent Labs: 05/30/2021: Hemoglobin 11.2;  Platelets 160   Recent Lipid Panel No results found for: "CHOL", "TRIG", "HDL", "CHOLHDL", "LDLCALC", "LDLDIRECT"  Physical Exam:    VS:  BP (!) 118/54   Pulse 69   Ht 5\' 3"  (1.6 m)   Wt 215 lb 3.2 oz (97.6 kg)   SpO2 97%   BMI 38.12 kg/m     Wt Readings from Last 3 Encounters:  08/30/21 215 lb 3.2 oz (97.6 kg)  08/21/21 224 lb 6.4 oz (101.8 kg)  05/30/21 235 lb 3.2 oz (106.7 kg)     GEN:  Well nourished, well developed in no acute distress HEENT: Normal NECK: No JVD; No carotid bruits LYMPHATICS: No lymphadenopathy CARDIAC: RRR, no murmurs, rubs, gallops RESPIRATORY:  Clear to auscultation without rales, wheezing or rhonchi  ABDOMEN: Soft, non-tender, non-distended MUSCULOSKELETAL:  No edema; No deformity  SKIN: Warm and dry NEUROLOGIC:  Alert and oriented x 3 PSYCHIATRIC:  Normal affect    Risk Assessment/Risk Calculators:                 ASSESSMENT & PLAN:    Gestational hypertension.  Her blood pressure is acceptable.  No need for any antihypertensive medication at this time. She truly has depressed and have asked the patient to see if there are options for virtual evaluation. The patient understands the need to lose weight with diet and exercise. We have discussed specific strategies for this.   Patient Instructions  Medication Instructions:  Your physician recommends that you continue on your current medications as directed. Please refer to the Current Medication list given to you today.  *If you need a refill on your cardiac medications before your next appointment, please call your  pharmacy*   Lab Work: NONE If you have labs (blood work) drawn today and your tests are completely normal, you will receive your results only by: MyChart Message (if you have MyChart) OR A paper copy in the mail If you have any lab test that is abnormal or we need to change your treatment, we will call you to review the results.   Testing/Procedures: NONE   Follow-Up: At [facility], you and your health needs are our priority.  As part of our continuing mission to provide you with exceptional heart care, we have created designated Provider Care Teams.  These Care Teams include your primary Cardiologist (physician) and Advanced Practice Providers (APPs -  Physician Assistants and Nurse Practitioners) who all work together to provide you with the care you need, when you need it.  We recommend signing up for the patient portal called "MyChart".  Sign up information is provided on this After Visit Summary.  MyChart is used to connect with patients for Virtual Visits (Telemedicine).  Patients are able to view lab/test results, encounter notes, upcoming appointments, etc.  Non-urgent messages can be sent to your provider as well.   To learn more about what you can do with MyChart, go to [facility].    Your next appointment:   As Needed    Provider:   [email or web address], DO     Dispo:  No follow-ups on file.   Medication Adjustments/Labs and Tests Ordered: Current medicines are reviewed at length with the patient today.  Concerns regarding medicines are outlined above.  Tests Ordered: Orders Placed This Encounter  Procedures   EKG 12-Lead   Medication Changes: No orders of the defined types were placed in this encounter.

## 2021-09-16 ENCOUNTER — Encounter: Payer: Self-pay | Admitting: Family Medicine

## 2021-10-16 ENCOUNTER — Other Ambulatory Visit: Payer: Self-pay | Admitting: Family Medicine

## 2021-10-16 DIAGNOSIS — F53 Postpartum depression: Secondary | ICD-10-CM

## 2021-11-05 ENCOUNTER — Encounter: Payer: Self-pay | Admitting: Family Medicine

## 2021-11-05 ENCOUNTER — Telehealth: Payer: Medicaid Other | Admitting: Physician Assistant

## 2021-11-05 DIAGNOSIS — K625 Hemorrhage of anus and rectum: Secondary | ICD-10-CM

## 2021-11-05 NOTE — Progress Notes (Signed)
Because you have not had a formal diagnosis of hemorrhoid and giving current symptoms, I feel your condition warrants further evaluation and I recommend that you be seen for a face to face visit.  Please contact your primary care physician practice to be seen. Many offices offer virtual options to be seen via video if you are not comfortable going in person to a medical facility at this time.  NOTE: You will NOT be charged for this eVisit.  If you do not have a PCP,  offers a free physician referral service available at [phone]. Our trained staff has the experience, knowledge and resources to put you in touch with a physician who is right for you.    If you are having a true medical emergency please call 911.   Your e-visit answers were reviewed by a board certified advanced clinical practitioner to complete your personal care plan.  Thank you for using e-Visits.

## 2021-11-07 NOTE — Progress Notes (Signed)
This encounter was created in error - please disregard.

## 2021-11-08 ENCOUNTER — Other Ambulatory Visit: Payer: Self-pay | Admitting: Family Medicine

## 2021-11-08 MED ORDER — PRENATAL VITAMIN 27-0.8 MG PO TABS: 1.0000 | ORAL_TABLET | Freq: Every day | ORAL | 11 refills | Status: DC

## 2021-11-18 ENCOUNTER — Encounter: Payer: Self-pay | Admitting: Family Medicine

## 2021-12-24 ENCOUNTER — Ambulatory Visit: Payer: Medicaid Other | Admitting: Family Medicine

## 2022-01-09 ENCOUNTER — Ambulatory Visit: Payer: Medicaid Other | Admitting: Family Medicine

## 2022-01-21 ENCOUNTER — Encounter: Payer: Self-pay | Admitting: Family Medicine

## 2022-02-05 ENCOUNTER — Ambulatory Visit: Payer: Medicaid Other | Admitting: Family Medicine

## 2022-02-07 ENCOUNTER — Other Ambulatory Visit (HOSPITAL_COMMUNITY)
Admission: RE | Admit: 2022-02-07 | Discharge: 2022-02-07 | Disposition: A | Discharge status: Home / Self Care | Payer: Medicaid Other | Source: Ambulatory Visit | Attending: Family Medicine | Admitting: Family Medicine

## 2022-02-07 ENCOUNTER — Ambulatory Visit (INDEPENDENT_AMBULATORY_CARE_PROVIDER_SITE_OTHER): Payer: Medicaid Other | Admitting: Family Medicine

## 2022-02-07 ENCOUNTER — Other Ambulatory Visit: Payer: Self-pay

## 2022-02-07 ENCOUNTER — Encounter: Payer: Self-pay | Admitting: Family Medicine

## 2022-02-07 VITALS — BP 137/69 | HR 97 | Wt 216.0 lb

## 2022-02-07 DIAGNOSIS — N898 Other specified noninflammatory disorders of vagina: Secondary | ICD-10-CM

## 2022-02-07 DIAGNOSIS — Z3201 Encounter for pregnancy test, result positive: Secondary | ICD-10-CM

## 2022-02-07 DIAGNOSIS — Z124 Encounter for screening for malignant neoplasm of cervix: Secondary | ICD-10-CM | POA: Insufficient documentation

## 2022-02-07 DIAGNOSIS — F53 Postpartum depression: Secondary | ICD-10-CM | POA: Diagnosis not present

## 2022-02-07 DIAGNOSIS — Z113 Encounter for screening for infections with a predominantly sexual mode of transmission: Secondary | ICD-10-CM | POA: Insufficient documentation

## 2022-02-07 DIAGNOSIS — R350 Frequency of micturition: Secondary | ICD-10-CM | POA: Insufficient documentation

## 2022-02-07 DIAGNOSIS — N760 Acute vaginitis: Secondary | ICD-10-CM

## 2022-02-07 DIAGNOSIS — B9689 Other specified bacterial agents as the cause of diseases classified elsewhere: Secondary | ICD-10-CM

## 2022-02-07 DIAGNOSIS — N912 Amenorrhea, unspecified: Secondary | ICD-10-CM

## 2022-02-07 DIAGNOSIS — R300 Dysuria: Secondary | ICD-10-CM | POA: Diagnosis present

## 2022-02-07 HISTORY — DX: Encounter for screening for malignant neoplasm of cervix: Z12.4

## 2022-02-07 HISTORY — DX: Other specified noninflammatory disorders of vagina: N89.8

## 2022-02-07 HISTORY — DX: Frequency of micturition: R35.0

## 2022-02-07 HISTORY — DX: Encounter for screening for infections with a predominantly sexual mode of transmission: Z11.3

## 2022-02-07 LAB — POCT WET PREP (WET MOUNT)
Clue Cells Wet Prep Whiff POC: POSITIVE
Trichomonas Wet Prep HPF POC: ABSENT

## 2022-02-07 LAB — POCT URINALYSIS DIP (MANUAL ENTRY)
Blood, UA: NEGATIVE
Glucose, UA: NEGATIVE mg/dL
Nitrite, UA: NEGATIVE
Protein Ur, POC: 30 mg/dL — AB
Spec Grav, UA: 1.025 (ref 1.010–1.025)
Urobilinogen, UA: 0.2 E.U./dL
pH, UA: 6 (ref 5.0–8.0)

## 2022-02-07 LAB — POCT UA - MICROSCOPIC ONLY: Epithelial cells, urine per micros: >20

## 2022-02-07 MED ORDER — METRONIDAZOLE 500 MG PO TABS: 500.0000 mg | ORAL_TABLET | Freq: Two times a day (BID) | ORAL | 0 refills | Status: AC

## 2022-02-07 NOTE — Patient Instructions (Addendum)
It was wonderful to see you today.  Today we talked about:  We are doing lab work today to check pregnancy levels. It is possible that you may be experiencing a miscarriage. I will let you know the next steps once your labs return.  We performed your Pap smear today as well as routine testing for sexually transmitted infections.  We are also checking for urinary tract infection.  I will send you a MyChart message if you have MyChart. Otherwise, I will give you a call for abnormal results or send a letter if everything returned back normal. If you don't hear from me in 2 weeks, please call the office.    Thank you for coming to your visit as scheduled. We have had a large "no-show" problem lately, and this significantly limits our ability to see and care for patients. As a friendly reminder- if you cannot make your appointment please call to cancel. We do have a no show policy for those who do not cancel within 24 hours. Our policy is that if you miss or fail to cancel an appointment within 24 hours, 3 times in a 77-month period, you may be dismissed from our clinic.   Thank you for choosing [facility].   Please call [phone] with any questions about today's appointment.  Please be sure to schedule follow up at the front  desk before you leave today.   Sharion Settler, DO PGY-3 Family Medicine

## 2022-02-07 NOTE — Assessment & Plan Note (Addendum)
She has very flat affect and appears depressed today.  Her PHQ 9 score is low at 0, however, she does admit to feeling depressed. No SI. Previously given resources for therapy, she has significant transportation issues which makes this hard.  She also notes difficulties with data making virtual appointments difficult.  She was previously started on Zoloft, however she felt it was making her depression worse.  She is again interested in resources for therapy, request these be sent to her MyChart.

## 2022-02-07 NOTE — Assessment & Plan Note (Signed)
She had a positive home pregnancy test.  Unfortunately we are unable to collect a urine pregnancy test today (do not have this in stock), so we will proceed with blood testing.  I am concerned that she may have miscarried given associated lower abdominal pain, vaginal bleeding/clots.  She notes that if she is pregnant, she would not desire to keep this pregnancy.  Will offer resources.  She would like this to be sent over to her MyChart. Will collect blood testing today to check blood type/Rh status.

## 2022-02-07 NOTE — Assessment & Plan Note (Signed)
Screening for all STIs today. Will treat as appropriate.  Recommend barrier protection to prevent STIs/unwanted pregnancy.

## 2022-02-07 NOTE — Assessment & Plan Note (Signed)
Collected today without co-HPV testing. Recollect in 3 years if normal.

## 2022-02-07 NOTE — Assessment & Plan Note (Addendum)
Routine STI screening performed today. Vaginal discharge appreciated on examination. Wet prep returned positive for BV, will treat with flagyl. Will f/u other labs and treat as appropriate.

## 2022-02-07 NOTE — Progress Notes (Signed)
SUBJECTIVE:   CHIEF COMPLAINT / HPI:   Anita Hayes is a 22 y.o. female who presents to the [facility] clinic today to discuss the following concerns:   Vaginal Bleeding, positive home pregnancy test Took a pregnancy test at home December 27th and it was positive.She notes that she was bleeding at that time (bleeding from 12/4-9, 12/29-1/3), it was her week off of her birth control but she does not normally have a period during her week off.  11/9 was LMP. She swears patch for contraception.  She has a 22 year old and an 3 month old. She is not breast feeding. She is not interested in any more children. Sexually active with female partner, uses condoms intermittently.  Urinary symptoms Onset: started about a month ago Dysuria: present Increased urinary frequency: Yes  Urinary urgency: No  Incontinence: No  Abdominal pain: Yes , lower abdominal  Hematuria: No  Nausea/vomiting: No  Flank pain: No  Fever: No   STI screening Sexually active with one female partner. Uses condoms sometimes. Has hx of chlamydia and gonorrhea when she was pregnant. Has had Mcqueen vaginal discharge, leaves a black/brown mark on underwear. Also with some itching.   PERTINENT  PMH / PSH: Sickle cell trait, alpha thalassemia trait  OBJECTIVE:   BP 137/69   Pulse 97   Wt 216 lb (98 kg)   LMP 12/12/2021   SpO2 99%   BMI 38.26 kg/m    General: NAD, pleasant, able to participate in exam Respiratory: normal effort Abdomen: Bowel sounds present, non-distended, soft Back: No CVAT bilaterally  GU: Normal appearance of labia majora and minora, without lesions. Vagina tissue pink, moist, without lesions or abrasions. Copious Dirk/yellow discharge seen at vaginal vault. Cervix normal appearance, friable, with thin Berdan/yellow discharge from os.  Psych: Flat affect and depressed mood  GU exam chaperoned by Delray Alt, CMA      02/07/2022    2:31 PM 08/21/2021   11:05 AM 05/23/2021   11:23 AM  Depression  screen PHQ 2/9  Decreased Interest 1 0 0  Down, Depressed, Hopeless 0 0 0  PHQ - 2 Score 1 0 0  Altered sleeping 0 0 0  Tired, decreased energy 0 0 0  Change in appetite 0 0 0  Feeling bad or failure about yourself  0 0 0  Trouble concentrating 0 0 0  Moving slowly or fidgety/restless 0 0 0  Suicidal thoughts 0 0 0  PHQ-9 Score 1 0 0  Difficult doing work/chores  Not difficult at all      ASSESSMENT/PLAN:   Positive pregnancy test She had a positive home pregnancy test.  Unfortunately we are unable to collect a urine pregnancy test today (do not have this in stock), so we will proceed with blood testing.  I am concerned that she may have miscarried given associated lower abdominal pain, vaginal bleeding/clots.  She notes that if she is pregnant, she would not desire to keep this pregnancy.  Will offer resources.  She would like this to be sent over to her MyChart. Will collect blood testing today to check blood type/Rh status.   Vaginal discharge Screening for all STIs today. Will treat as appropriate.  Recommend barrier protection to prevent STIs/unwanted pregnancy.  Urinary frequency Unfortunately urinalysis collected was a dirty sample with greater than 20 epithelial cells. Symptoms ongoing for 1 month ago. Will f/u Ucx and treat as indicated though may grow multiple species given not clean urine sample. Afebrile, no  CVAT or N/V to make me concerned for pyelo.  Abdominal pain could also be related to possible miscarriage/pregnancy.  Depressed mood with postpartum onset She has very flat affect and appears depressed today.  Her PHQ 9 score is low at 0, however, she does admit to feeling depressed. No SI. Previously given resources for therapy, she has significant transportation issues which makes this hard.  She also notes difficulties with data making virtual appointments difficult.  She was previously started on Zoloft, however she felt it was making her depression worse.  She is  again interested in resources for therapy, request these be sent to her MyChart.  Pap smear for cervical cancer screening Collected today without co-HPV testing. Recollect in 3 years if normal.  Routine screening for STI (sexually transmitted infection) Routine STI screening performed today. Vaginal discharge appreciated on examination. Wet prep returned positive for BV, will treat with flagyl. Will f/u other labs and treat as appropriate.     Sharion Settler, [place]

## 2022-02-07 NOTE — Assessment & Plan Note (Signed)
Unfortunately urinalysis collected was a dirty sample with greater than 20 epithelial cells. Symptoms ongoing for 1 month ago. Will f/u Ucx and treat as indicated though may grow multiple species given not clean urine sample. Afebrile, no CVAT or N/V to make me concerned for pyelo.  Abdominal pain could also be related to possible miscarriage/pregnancy.

## 2022-02-10 LAB — CBC/D/PLT+RPR+RH+ABO+RUBIGG...
Antibody Screen: NEGATIVE
Basophils Absolute: 0 10*3/uL (ref 0.0–0.2)
Basos: 1 %
Bilirubin, UA: NEGATIVE
EOS (ABSOLUTE): 0 10*3/uL (ref 0.0–0.4)
Eos: 1 %
Glucose, UA: NEGATIVE
HCV Ab: NONREACTIVE
HIV Screen 4th Generation wRfx: NONREACTIVE
Hematocrit: 39.9 % (ref 34.0–46.6)
Hemoglobin: 12.7 g/dL (ref 11.1–15.9)
Hepatitis B Surface Ag: NEGATIVE
Immature Grans (Abs): 0 10*3/uL (ref 0.0–0.1)
Immature Granulocytes: 0 %
Lymphocytes Absolute: 1.8 10*3/uL (ref 0.7–3.1)
Lymphs: 43 %
MCH: 22.7 pg — ABNORMAL LOW (ref 26.6–33.0)
MCHC: 31.8 g/dL (ref 31.5–35.7)
MCV: 71 fL — ABNORMAL LOW (ref 79–97)
Monocytes Absolute: 0.2 10*3/uL (ref 0.1–0.9)
Monocytes: 6 %
Neutrophils Absolute: 2.1 10*3/uL (ref 1.4–7.0)
Neutrophils: 49 %
Nitrite, UA: NEGATIVE
Platelets: 245 10*3/uL (ref 150–450)
RBC, UA: NEGATIVE
RBC: 5.59 x10E6/uL — ABNORMAL HIGH (ref 3.77–5.28)
RDW: 16.6 % — ABNORMAL HIGH (ref 11.7–15.4)
RPR Ser Ql: NONREACTIVE
Rh Factor: POSITIVE
Rubella Antibodies, IGG: 7.44 index (ref >0.99)
Specific Gravity, UA: >=1.03 — AB (ref 1.005–1.030)
Urobilinogen, Ur: 0.2 mg/dL (ref 0.2–1.0)
WBC: 4.2 10*3/uL (ref 3.4–10.8)
pH, UA: 6 (ref 5.0–7.5)

## 2022-02-10 LAB — MICROSCOPIC EXAMINATION
Epithelial Cells (non renal): >10 /hpf — AB (ref 0–10)
RBC, Urine: NONE SEEN /hpf (ref 0–2)

## 2022-02-10 LAB — HGB SOLUBILITY: Hgb Solubility: POSITIVE — AB

## 2022-02-10 LAB — URINE CULTURE, OB REFLEX

## 2022-02-10 LAB — HGB FRACTIONATION CASCADE
Hgb A2: 3 % (ref 1.8–3.2)
Hgb A: 68.3 % — ABNORMAL LOW (ref 96.4–98.8)
Hgb F: 2 % (ref 0.0–2.0)
Hgb S: 26.7 % — ABNORMAL HIGH

## 2022-02-10 LAB — HCV INTERPRETATION

## 2022-02-10 LAB — URINE CULTURE

## 2022-02-10 LAB — HCG, SERUM, QUALITATIVE: hCG,Beta Subunit,Qual,Serum: NEGATIVE m[IU]/mL (ref <6)

## 2022-02-11 ENCOUNTER — Other Ambulatory Visit: Payer: Self-pay | Admitting: Family Medicine

## 2022-02-11 DIAGNOSIS — A749 Chlamydial infection, unspecified: Secondary | ICD-10-CM

## 2022-02-11 LAB — CYTOLOGY - PAP
Chlamydia: POSITIVE — AB
Comment: NEGATIVE
Comment: NEGATIVE
Comment: NORMAL
Diagnosis: UNDETERMINED — AB
High risk HPV: NEGATIVE
Neisseria Gonorrhea: NEGATIVE

## 2022-02-11 MED ORDER — DOXYCYCLINE HYCLATE 100 MG PO TABS: 100.0000 mg | ORAL_TABLET | Freq: Two times a day (BID) | ORAL | 0 refills | Status: AC

## 2022-02-12 ENCOUNTER — Telehealth: Payer: Self-pay

## 2022-02-12 NOTE — Telephone Encounter (Signed)
Reported to Chlamydia status to [facility].  Attention Brandi Sessoms.   Ozella Almond, [place]

## 2022-02-12 NOTE — Telephone Encounter (Signed)
-----   Message from [place] Pink, [place] sent at 02/12/2022 10:04 AM EST ----- Regarding: STI reporting Positive chlamydia

## 2022-05-01 ENCOUNTER — Emergency Department (HOSPITAL_COMMUNITY)
Admission: EM | Admit: 2022-05-01 | Discharge: 2022-05-01 | Disposition: A | Discharge status: Home / Self Care | Payer: Medicaid Other | Attending: Emergency Medicine | Admitting: Emergency Medicine

## 2022-05-01 ENCOUNTER — Other Ambulatory Visit: Payer: Self-pay

## 2022-05-01 ENCOUNTER — Emergency Department (HOSPITAL_COMMUNITY): Payer: Medicaid Other

## 2022-05-01 ENCOUNTER — Encounter (HOSPITAL_COMMUNITY): Payer: Self-pay

## 2022-05-01 DIAGNOSIS — N939 Abnormal uterine and vaginal bleeding, unspecified: Secondary | ICD-10-CM

## 2022-05-01 DIAGNOSIS — N760 Acute vaginitis: Secondary | ICD-10-CM | POA: Insufficient documentation

## 2022-05-01 DIAGNOSIS — I493 Ventricular premature depolarization: Secondary | ICD-10-CM

## 2022-05-01 DIAGNOSIS — B9689 Other specified bacterial agents as the cause of diseases classified elsewhere: Secondary | ICD-10-CM | POA: Diagnosis not present

## 2022-05-01 LAB — CBC WITH DIFFERENTIAL/PLATELET
Abs Immature Granulocytes: 0.01 10*3/uL (ref 0.00–0.07)
Basophils Absolute: 0 10*3/uL (ref 0.0–0.1)
Basophils Relative: 0 %
Eosinophils Absolute: 0.1 10*3/uL (ref 0.0–0.5)
Eosinophils Relative: 2 %
HCT: 38.4 % (ref 36.0–46.0)
Hemoglobin: 12 g/dL (ref 12.0–15.0)
Immature Granulocytes: 0 %
Lymphocytes Relative: 43 %
Lymphs Abs: 1.9 10*3/uL (ref 0.7–4.0)
MCH: 22.6 pg — ABNORMAL LOW (ref 26.0–34.0)
MCHC: 31.3 g/dL (ref 30.0–36.0)
MCV: 72.2 fL — ABNORMAL LOW (ref 80.0–100.0)
Monocytes Absolute: 0.3 10*3/uL (ref 0.1–1.0)
Monocytes Relative: 7 %
Neutro Abs: 2.1 10*3/uL (ref 1.7–7.7)
Neutrophils Relative %: 48 %
Platelets: 200 10*3/uL (ref 150–400)
RBC: 5.32 MIL/uL — ABNORMAL HIGH (ref 3.87–5.11)
RDW: 14.8 % (ref 11.5–15.5)
WBC: 4.5 10*3/uL (ref 4.0–10.5)
nRBC: 0 % (ref 0.0–0.2)

## 2022-05-01 LAB — URINALYSIS, W/ REFLEX TO CULTURE (INFECTION SUSPECTED)
Bilirubin Urine: NEGATIVE
Glucose, UA: NEGATIVE mg/dL
Ketones, ur: NEGATIVE mg/dL
Leukocytes,Ua: NEGATIVE
Nitrite: NEGATIVE
Protein, ur: NEGATIVE mg/dL
RBC / HPF: >50 RBC/hpf (ref 0–5)
Specific Gravity, Urine: 1.008 (ref 1.005–1.030)
pH: 6 (ref 5.0–8.0)

## 2022-05-01 LAB — BASIC METABOLIC PANEL
Anion gap: 10 (ref 5–15)
BUN: 7 mg/dL (ref 6–20)
CO2: 23 mmol/L (ref 22–32)
Calcium: 9 mg/dL (ref 8.9–10.3)
Chloride: 105 mmol/L (ref 98–111)
Creatinine, Ser: 0.69 mg/dL (ref 0.44–1.00)
GFR, Estimated: >60 mL/min (ref >60)
Glucose, Bld: 105 mg/dL — ABNORMAL HIGH (ref 70–99)
Potassium: 3.9 mmol/L (ref 3.5–5.1)
Sodium: 138 mmol/L (ref 135–145)

## 2022-05-01 LAB — PROTIME-INR
INR: 1 (ref 0.8–1.2)
Prothrombin Time: 12.8 seconds (ref 11.4–15.2)

## 2022-05-01 LAB — HCG, QUANTITATIVE, PREGNANCY: hCG, Beta Chain, Quant, S: <1 m[IU]/mL (ref <5)

## 2022-05-01 LAB — WET PREP, GENITAL
Sperm: NONE SEEN
Trich, Wet Prep: NONE SEEN
WBC, Wet Prep HPF POC: <10 (ref <10)
Yeast Wet Prep HPF POC: NONE SEEN

## 2022-05-01 MED ORDER — METRONIDAZOLE 500 MG PO TABS: 500.0000 mg | ORAL_TABLET | Freq: Two times a day (BID) | ORAL | 0 refills | Status: DC

## 2022-05-01 MED ORDER — LACTATED RINGERS IV BOLUS
1000.0000 mL | Freq: Once | INTRAVENOUS | Status: AC
  Administered 2022-05-01: 1000 mL via INTRAVENOUS

## 2022-05-01 NOTE — ED Provider Notes (Signed)
[facility] Provider Note   CSN: [id number] Arrival date & time: 05/01/22  1149     History  Chief Complaint  Patient presents with   Vaginal Bleeding    Anita Hayes is a 22 y.o. female.  With PMH of previous STI, sickle cell trait, anemia presenting with vaginal bleeding x 10 days.  Patient typically has monthly periods that last 4 to 5 days at maximum.  She uses a birth control patch.  She is currently on her birth control patch.  She changes this every 3 weeks.  She has been having bleeding for the past 10 days.  It has lightened over time.  She is only using a pad a day at this point.  She has had no fevers, no nausea, no vomiting, no diarrhea.  She does endorse some mild abdominal cramping.  No pain with sex.  Last time she had intercourse was prior to her menstrual period starting.  She is not concerned for STD and has had no abnormal discharge burning or itching.  No history of bleeding or clotting disorders.  No chest pain or shortness of breath or syncopal episodes.   Vaginal Bleeding      Home Medications Prior to Admission medications   Medication Sig Start Date End Date Taking? Authorizing Provider  metroNIDAZOLE (FLAGYL) 500 MG tablet Take 1 tablet (500 mg total) by mouth 2 (two) times daily. 05/01/22  Yes Elgie Congo, MD  norelgestromin-ethinyl estradiol Anita Hayes) 150-35 MCG/24HR transdermal patch Place 1 patch onto the skin once a week. 08/21/21   Alcus Dad, MD  Prenatal Vit-Fe Fumarate-FA (PRENATAL VITAMIN) 27-0.8 MG TABS Take 1 tablet by mouth daily. 11/08/21   Lilland, Alana, DO  sertraline (ZOLOFT) 50 MG tablet TAKE 1 TABLET BY MOUTH EVERY DAY 10/16/21   Lilland, Alana, DO      Allergies    Patient has no known allergies.    Review of Systems   Review of Systems  Genitourinary:  Positive for vaginal bleeding.    Physical Exam Updated Vital Signs BP 129/69 (BP Location: Left Arm)   Pulse 87   Temp  98.6 F (37 C) (Oral)   Resp 16   Ht 5\' 3"  (1.6 m)   Wt 98 kg   SpO2 100%   BMI 38.27 kg/m  Physical Exam Constitutional: Alert and oriented. Well appearing and in no distress. Eyes: Conjunctivae are normal. ENT      Head: Normocephalic and atraumatic. Cardiovascular: S1, S2,  Normal and symmetric distal pulses are present in all extremities.Warm and well perfused. Respiratory: Normal respiratory effort. O2 sat 100 on RA Gastrointestinal: Soft and nontender. There is no CVA tenderness. GU: Performed with bedside NT present as chaperone.  There is no external concerning skin lesions of the genitalia.  No CMT.  Mild bleeding pooled in vaginal fornix.  No active bleeding from cervix.  No palpable masses or lesions. Cervix pink. Musculoskeletal: Normal range of motion in all extremities.. Neurologic: Normal speech and language.  Skin: Skin is warm, dry and intact. No rash noted. Psychiatric: Mood and affect are normal. Speech and behavior are normal.  ED Results / Procedures / Treatments   Labs (all labs ordered are listed, but only abnormal results are displayed) Labs Reviewed  WET PREP, GENITAL - Abnormal; Notable for the following components:      Result Value   Clue Cells Wet Prep HPF POC PRESENT (*)    All other components within normal  limits  URINALYSIS, W/ REFLEX TO CULTURE (INFECTION SUSPECTED) - Abnormal; Notable for the following components:   Hgb urine dipstick LARGE (*)    Bacteria, UA RARE (*)    All other components within normal limits  BASIC METABOLIC PANEL - Abnormal; Notable for the following components:   Glucose, Bld 105 (*)    All other components within normal limits  CBC WITH DIFFERENTIAL/PLATELET - Abnormal; Notable for the following components:   RBC 5.32 (*)    MCV 72.2 (*)    MCH 22.6 (*)    All other components within normal limits  PROTIME-INR  HCG, QUANTITATIVE, PREGNANCY  GC/CHLAMYDIA PROBE AMP ([place]) NOT AT [facility]     EKG None  Radiology US Pelvis Complete  Result Date: 05/01/2022 CLINICAL DATA:  heavy bleeding EXAM: TRANSABDOMINAL ULTRASOUND OF PELVIS TECHNIQUE: Transabdominalultrasound examination of the pelvis was performed including evaluation of the uterus, ovaries, adnexal regions, and pelvic cul-de-sac. Transvaginal imaging was not performed at the request of the patient, and this limits the study. COMPARISON:  None Available. FINDINGS: Uterusanteverted, 7 x 5 x 4 cm. The endometrium is not well visualized. The myometrium appears heterogeneous. These findings could indicate adenomyosis. Consider pelvic MRI for further evaluation of this finding. Right ovary Unremarkable, 2.2 x 1.9 x 1.6 cm Left ovary Unremarkable, 2.5 x 2.0 x 1.6 cm Images of the adnexae demonstrated no masses or fluid collections. IMPRESSION: 1. Study limited by the patient's request to defer transvaginal imaging. 2. Possible adenomyosis which can be assessed further with pelvic MRI. 3. Otherwise unremarkable study. Electronically Signed   By: Sammie Bench M.D.   On: 05/01/2022 13:10    Procedures Procedures  Was on constant cardiac monitoring, sinus rhythm with intermittent PVCs.  Medications Ordered in ED Medications  lactated ringers bolus 1,000 mL (0 mLs Intravenous Stopped 05/01/22 1332)    ED Course/ Medical Decision Making/ A&P   {    Medical Decision Making  Anita Hayes is a 22 y.o. female.  With PMH of previous STI, sickle cell trait, anemia presenting with vaginal bleeding x 10 days.   Patient presents with nonpregnant vaginal bleeding.  Pregnancy test negative here.  Differential includes but not limited to structural issues such as polyps, fibroids, adenomyosis, or other possible issues such as coagulopathy or ovulatory dysfunction among multiple other etiologies.  Infectious swab sent here, wet prep positive for BV.  Her labs were unremarkable normal hemoglobin 12 normal platelet count normal INR 1, no  concern for coagulopathy.  Pelvic ultrasound obtained with findings suggestive of possible adenomyosis.  Patient hemodynamically stable with no need for transfusion.  Advise close follow-up with OB/GYN for possible findings of adenomyosis and irregular menstrual bleeding.  Sent for prescription for Flagyl for 7 days for BV.  Put in referral for cardiology for asymptomatic PVCs noted today on cardiac monitoring.  Amount and/or Complexity of Data Reviewed Labs: ordered. Radiology: ordered.  Risk Prescription drug management.    Final Clinical Impression(s) / ED Diagnoses Final diagnoses:  Vaginal bleeding  Bacterial vaginosis  Asymptomatic PVCs    Rx / DC Orders ED Discharge Orders          Ordered    metroNIDAZOLE (FLAGYL) 500 MG tablet  2 times daily        05/01/22 1319    Ambulatory referral to Cardiology       Comments: If you have not heard from the Cardiology office within the next 72 hours please call [phone].   05/01/22 1333  Elgie Congo, MD 05/01/22 1355

## 2022-05-01 NOTE — ED Triage Notes (Addendum)
Patient said her period has lasted for 10 days. Going through 1 pad a day. She stated it is a normal flow. No pain. Patient called her OBGYN but they didn't answer and patient got worried.

## 2022-05-01 NOTE — Discharge Instructions (Addendum)
You were seen for vaginal bleeding.  Overall your workup was very unremarkable and reassuring including your blood work, urine and ultrasound.  There were findings that could suggest "adenomyosis" which are benign changes in your uterus that can lead to heavier bleeding.  This can be further discussed with your gynecologist.  You will be called if you have any evidence of infection on your swabs today.  You did have bacterial vaginosis this is not an STD.  However it is treated with antibiotics.  You can take the Flagyl as prescribed and do not drink with any alcohol.  Follow-up with your gynecologist regarding your visit to the ER today.  Come back if any heavier bleeding such as bleeding through a pad every hour for 3 or more hours, fainting, severe chest pain or shortness of breath, severe abdominal pain, or any other symptoms concerning to you.

## 2022-05-02 LAB — GC/CHLAMYDIA PROBE AMP (~~LOC~~) NOT AT ARMC
Chlamydia: NEGATIVE
Comment: NEGATIVE
Comment: NORMAL
Neisseria Gonorrhea: NEGATIVE

## 2022-06-22 ENCOUNTER — Other Ambulatory Visit: Payer: Self-pay | Admitting: Family Medicine

## 2022-06-22 DIAGNOSIS — Z30016 Encounter for initial prescription of transdermal patch hormonal contraceptive device: Secondary | ICD-10-CM

## 2022-07-30 ENCOUNTER — Emergency Department (HOSPITAL_COMMUNITY): Payer: Medicaid Other

## 2022-07-30 ENCOUNTER — Emergency Department (HOSPITAL_COMMUNITY)
Admission: EM | Admit: 2022-07-30 | Discharge: 2022-07-31 | Disposition: A | Discharge status: Home / Self Care | Payer: Medicaid Other | Attending: Emergency Medicine | Admitting: Emergency Medicine

## 2022-07-30 ENCOUNTER — Other Ambulatory Visit: Payer: Self-pay

## 2022-07-30 ENCOUNTER — Encounter (HOSPITAL_COMMUNITY): Payer: Self-pay

## 2022-07-30 DIAGNOSIS — N949 Unspecified condition associated with female genital organs and menstrual cycle: Secondary | ICD-10-CM

## 2022-07-30 DIAGNOSIS — N739 Female pelvic inflammatory disease, unspecified: Secondary | ICD-10-CM | POA: Diagnosis not present

## 2022-07-30 DIAGNOSIS — N83291 Other ovarian cyst, right side: Secondary | ICD-10-CM | POA: Diagnosis not present

## 2022-07-30 DIAGNOSIS — Z1152 Encounter for screening for COVID-19: Secondary | ICD-10-CM | POA: Diagnosis not present

## 2022-07-30 DIAGNOSIS — R1033 Periumbilical pain: Secondary | ICD-10-CM | POA: Diagnosis present

## 2022-07-30 LAB — WET PREP, GENITAL
Sperm: NONE SEEN
Trich, Wet Prep: NONE SEEN
WBC, Wet Prep HPF POC: >=10 — AB (ref <10)
Yeast Wet Prep HPF POC: NONE SEEN

## 2022-07-30 LAB — COMPREHENSIVE METABOLIC PANEL
ALT: 12 U/L (ref 0–44)
AST: 23 U/L (ref 15–41)
Albumin: 3.7 g/dL (ref 3.5–5.0)
Alkaline Phosphatase: 51 U/L (ref 38–126)
Anion gap: 9 (ref 5–15)
BUN: 7 mg/dL (ref 6–20)
CO2: 20 mmol/L — ABNORMAL LOW (ref 22–32)
Calcium: 8.4 mg/dL — ABNORMAL LOW (ref 8.9–10.3)
Chloride: 104 mmol/L (ref 98–111)
Creatinine, Ser: 0.78 mg/dL (ref 0.44–1.00)
GFR, Estimated: >60 mL/min (ref >60)
Glucose, Bld: 107 mg/dL — ABNORMAL HIGH (ref 70–99)
Potassium: 4.1 mmol/L (ref 3.5–5.1)
Sodium: 133 mmol/L — ABNORMAL LOW (ref 135–145)
Total Bilirubin: 1.7 mg/dL — ABNORMAL HIGH (ref 0.3–1.2)
Total Protein: 7.1 g/dL (ref 6.5–8.1)

## 2022-07-30 LAB — RESP PANEL BY RT-PCR (RSV, FLU A&B, COVID)  RVPGX2
Influenza A by PCR: NEGATIVE
Influenza B by PCR: NEGATIVE
Resp Syncytial Virus by PCR: NEGATIVE
SARS Coronavirus 2 by RT PCR: NEGATIVE

## 2022-07-30 LAB — CBC
HCT: 35.4 % — ABNORMAL LOW (ref 36.0–46.0)
Hemoglobin: 11 g/dL — ABNORMAL LOW (ref 12.0–15.0)
MCH: 22.5 pg — ABNORMAL LOW (ref 26.0–34.0)
MCHC: 31.1 g/dL (ref 30.0–36.0)
MCV: 72.4 fL — ABNORMAL LOW (ref 80.0–100.0)
Platelets: 178 10*3/uL (ref 150–400)
RBC: 4.89 MIL/uL (ref 3.87–5.11)
RDW: 14.4 % (ref 11.5–15.5)
WBC: 14.3 10*3/uL — ABNORMAL HIGH (ref 4.0–10.5)
nRBC: 0 % (ref 0.0–0.2)

## 2022-07-30 LAB — HCG, SERUM, QUALITATIVE: Preg, Serum: NEGATIVE

## 2022-07-30 LAB — PROTIME-INR
INR: 1.2 (ref 0.8–1.2)
Prothrombin Time: 15.4 seconds — ABNORMAL HIGH (ref 11.4–15.2)

## 2022-07-30 LAB — LIPASE, BLOOD: Lipase: 24 U/L (ref 11–51)

## 2022-07-30 LAB — LACTIC ACID, PLASMA
Lactic Acid, Venous: 1.1 mmol/L (ref 0.5–1.9)
Lactic Acid, Venous: 0.9 mmol/L (ref 0.5–1.9)

## 2022-07-30 LAB — APTT: aPTT: 28 seconds (ref 24–36)

## 2022-07-30 MED ORDER — METRONIDAZOLE 500 MG PO TABS
500.0000 mg | ORAL_TABLET | Freq: Once | ORAL | Status: AC
  Administered 2022-07-30: 500 mg via ORAL
  Filled 2022-07-30: qty 1

## 2022-07-30 MED ORDER — LACTATED RINGERS IV SOLN: INTRAVENOUS | Status: DC

## 2022-07-30 MED ORDER — FENTANYL CITRATE PF 50 MCG/ML IJ SOSY
50.0000 ug | PREFILLED_SYRINGE | Freq: Once | INTRAMUSCULAR | Status: AC
  Administered 2022-07-30: 50 ug via INTRAVENOUS
  Filled 2022-07-30: qty 1

## 2022-07-30 MED ORDER — ONDANSETRON HCL 4 MG PO TABS: 4.0000 mg | ORAL_TABLET | Freq: Three times a day (TID) | ORAL | 0 refills | Status: AC | PRN

## 2022-07-30 MED ORDER — SODIUM CHLORIDE 0.9 % IV SOLN
1.0000 g | Freq: Once | INTRAVENOUS | Status: AC
  Administered 2022-07-30: 1 g via INTRAVENOUS
  Filled 2022-07-30: qty 10

## 2022-07-30 MED ORDER — METRONIDAZOLE 500 MG PO TABS: 500.0000 mg | ORAL_TABLET | Freq: Two times a day (BID) | ORAL | 0 refills | Status: AC

## 2022-07-30 MED ORDER — IOHEXOL 300 MG/ML  SOLN
100.0000 mL | Freq: Once | INTRAMUSCULAR | Status: AC | PRN
  Administered 2022-07-30: 100 mL via INTRAVENOUS

## 2022-07-30 MED ORDER — DOXYCYCLINE HYCLATE 100 MG PO CAPS: 100.0000 mg | ORAL_CAPSULE | Freq: Two times a day (BID) | ORAL | 0 refills | Status: AC

## 2022-07-30 MED ORDER — MORPHINE SULFATE (PF) 4 MG/ML IV SOLN
4.0000 mg | Freq: Once | INTRAVENOUS | Status: AC
  Administered 2022-07-30: 4 mg via INTRAVENOUS
  Filled 2022-07-30: qty 1

## 2022-07-30 MED ORDER — DOXYCYCLINE HYCLATE 100 MG PO TABS
100.0000 mg | ORAL_TABLET | Freq: Once | ORAL | Status: AC
  Administered 2022-07-30: 100 mg via ORAL
  Filled 2022-07-30: qty 1

## 2022-07-30 MED ORDER — ONDANSETRON HCL 4 MG/2ML IJ SOLN
4.0000 mg | Freq: Once | INTRAMUSCULAR | Status: AC
  Administered 2022-07-31: 4 mg via INTRAVENOUS

## 2022-07-30 MED ORDER — LACTATED RINGERS IV BOLUS
2000.0000 mL | Freq: Once | INTRAVENOUS | Status: AC
  Administered 2022-07-30: 2000 mL via INTRAVENOUS

## 2022-07-30 MED ORDER — ONDANSETRON HCL 4 MG/2ML IJ SOLN
4.0000 mg | Freq: Once | INTRAMUSCULAR | Status: DC
  Filled 2022-07-30: qty 2

## 2022-07-30 NOTE — Discharge Instructions (Addendum)
No sexual activity for 2 weeks. Any partners should be treated for possible sexually transmitted infection.   If still having symptoms, be rechecked in 3 days by PCP, urgent care, or in ED. You should have follow up ultrasound in 3-6 months for reassessment of cysts. If you develop worsening symptoms including fever, lightheadedness, uncontrollable nausea, confusion, worsening abdominal or pelvic pain, or other new, concerning symptoms, return to nearest ED immediately for re-evaluation.

## 2022-07-30 NOTE — Progress Notes (Signed)
Elink monitoring for the code sepsis protocol.  

## 2022-07-30 NOTE — ED Triage Notes (Signed)
BIB EMS from home for generalized abdominal pain that started yesterday and has worsened today. Took ibuprofen PTA and has had no relief. Denies pregnancy, denies N/V.

## 2022-07-30 NOTE — ED Notes (Signed)
Ultrasound at bedside

## 2022-07-30 NOTE — ED Provider Notes (Signed)
[place] [facility]ERGENCY DEPARTMENT AT [facility] Provider Note   CSN: [id number] Arrival date & time: 07/30/22  1920     History {Add pertinent medical, surgical, social history, OB history to HPI:1} Chief Complaint  Patient presents with   Abdominal Pain    Anita Hayes is a 22 y.o. female with past medical history of sickle cell trait who presents to the ED complaining of abdominal pain that started yesterday.  She says that she has been taking ibuprofen at home without relief of pain.  States that pain has become severe in nature and she has began to feel generally weak.  When she arrives, nursing staff stated that she was lethargic so I immediately went to bedside.  On initial evaluation, patient did appear uncomfortable which she stated was secondary to pain but was alert and oriented and answering questions appropriately.  She denies recent fever, dysuria, hematuria, vaginal bleeding or discharge, chest pain, shortness of breath, cough, congestion, nausea, vomiting, or diarrhea.  She states that her menstrual cycle just ended and there is not a possibility she is pregnant.  Denies concern for STDs.  Denies any known sick contacts.  Denies history of this pain.  No previous abdominal surgeries.      Home Medications Prior to Admission medications   Medication Sig Start Date End Date Taking? Authorizing Provider  doxycycline (VIBRAMYCIN) 100 MG capsule Take 1 capsule (100 mg total) by mouth 2 (two) times daily for 14 days. 07/30/22 08/13/22 Yes Tobenna Needs L, PA-C  ibuprofen (ADVIL) 200 MG tablet Take 200 mg by mouth every 6 (six) hours as needed for mild pain.   Yes [provider]  metroNIDAZOLE (FLAGYL) 500 MG tablet Take 1 tablet (500 mg total) by mouth 2 (two) times daily for 14 days. 07/30/22 08/13/22 Yes Jaisa Defino L, PA-C  ondansetron (ZOFRAN) 4 MG tablet Take 1 tablet (4 mg total) by mouth every 8 (eight) hours as needed for up to 5 days for nausea or  vomiting. 07/30/22 08/04/22 Yes Kate Sweetman L, PA-C  XULANE 150-35 MCG/24HR transdermal patch PLACE 1 PATCH ONTO THE SKIN ONCE A WEEK 06/23/22  Yes Lilland, Alana, DO  Prenatal Vit-Fe Fumarate-FA (PRENATAL VITAMIN) 27-0.8 MG TABS Take 1 tablet by mouth daily. Patient not taking: Reported on 07/30/2022 11/08/21   Lilland, Alana, DO  sertraline (ZOLOFT) 50 MG tablet TAKE 1 TABLET BY MOUTH EVERY DAY Patient not taking: Reported on 07/30/2022 10/16/21   Evelena Leyden, DO      Allergies    Patient has no known allergies.    Review of Systems   Review of Systems  All other systems reviewed and are negative.   Physical Exam Updated Vital Signs BP 138/78   Pulse 99   Temp 98.1 F (36.7 C) (Oral)   Resp (!) 26   Ht 5\' 3"  (1.6 m)   Wt 98 kg   SpO2 100%   BMI 38.27 kg/m  Physical Exam Vitals and nursing note reviewed. Exam conducted with a chaperone present.  Constitutional:      General: She is in acute distress.     Appearance: Normal appearance. She is ill-appearing. She is not toxic-appearing or diaphoretic.  HENT:     Head: Normocephalic and atraumatic.     Mouth/Throat:     Comments: Dry mucous membranes Eyes:     General: No scleral icterus.    Conjunctiva/sclera: Conjunctivae normal.  Cardiovascular:     Rate and Rhythm: Normal rate and regular rhythm.  Heart sounds: No murmur heard. Pulmonary:     Effort: Pulmonary effort is normal. No respiratory distress.     Breath sounds: Normal breath sounds. No stridor. No wheezing, rhonchi or rales.  Abdominal:     General: Abdomen is flat.     Palpations: Abdomen is soft. There is no pulsatile mass.     Tenderness: There is abdominal tenderness in the periumbilical area. There is guarding. There is no right CVA tenderness, left CVA tenderness or rebound. Positive signs include Murphy's sign. Negative signs include Rovsing's sign and McBurney's sign.  Genitourinary:    Pubic Area: No rash.      Labia:        Right: No rash,  tenderness or lesion.        Left: No rash, tenderness or lesion.      Vagina: Vaginal discharge (thick, off [place] mucopurulent, foul smelling) present. No erythema or bleeding.     Cervix: Cervical motion tenderness present. No friability or cervical bleeding.     Uterus: Not enlarged.      Adnexa:        Right: No tenderness or fullness.         Left: No tenderness or fullness.       Comments: Performed with primary RN, Kyla Balzarine, as chaperone Musculoskeletal:        General: Normal range of motion.     Cervical back: Neck supple.     Right lower leg: No edema.     Left lower leg: No edema.  Lymphadenopathy:     Lower Body: No right inguinal adenopathy. No left inguinal adenopathy.  Skin:    General: Skin is warm and dry.     Capillary Refill: Capillary refill takes less than 2 seconds.     Findings: No rash.  Neurological:     General: No focal deficit present.     Mental Status: She is alert and oriented to person, place, and time. Mental status is at baseline.  Psychiatric:        Mood and Affect: Affect is tearful.     ED Results / Procedures / Treatments   Labs (all labs ordered are listed, but only abnormal results are displayed) Labs Reviewed  COMPREHENSIVE METABOLIC PANEL - Abnormal; Notable for the following components:      Result Value   Sodium 133 (*)    CO2 20 (*)    Glucose, Bld 107 (*)    Calcium 8.4 (*)    Total Bilirubin 1.7 (*)    All other components within normal limits  CBC - Abnormal; Notable for the following components:   WBC 14.3 (*)    Hemoglobin 11.0 (*)    HCT 35.4 (*)    MCV 72.4 (*)    MCH 22.5 (*)    All other components within normal limits  PROTIME-INR - Abnormal; Notable for the following components:   Prothrombin Time 15.4 (*)    All other components within normal limits  RESP PANEL BY RT-PCR (RSV, FLU A&B, COVID)  RVPGX2  CULTURE, BLOOD (ROUTINE X 2)  CULTURE, BLOOD (ROUTINE X 2)  WET PREP, GENITAL  LIPASE, BLOOD  LACTIC ACID,  PLASMA  LACTIC ACID, PLASMA  APTT  HCG, SERUM, QUALITATIVE  URINALYSIS, ROUTINE W REFLEX MICROSCOPIC  HIV ANTIBODY (ROUTINE TESTING W REFLEX)  RPR  GC/CHLAMYDIA PROBE AMP ([place]) NOT AT [facility]    EKG None  Radiology CT ABDOMEN PELVIS W CONTRAST  Result Date: 07/30/2022 CLINICAL DATA:  Generalized abdominal pain since yesterday EXAM: CT ABDOMEN AND PELVIS WITH CONTRAST TECHNIQUE: Multidetector CT imaging of the abdomen and pelvis was performed using the standard protocol following bolus administration of intravenous contrast. RADIATION DOSE REDUCTION: This exam was performed according to the departmental dose-optimization program which includes automated exposure control, adjustment of the mA and/or kV according to patient size and/or use of iterative reconstruction technique. CONTRAST:  OMNIPAQUE IOHEXOL 300 MG/ML  SOLN COMPARISON:  None Available. FINDINGS: Lower chest: No acute pleural or parenchymal lung disease. Hepatobiliary: No focal liver abnormality is seen. No gallstones, gallbladder wall thickening, or biliary dilatation. Pancreas: Unremarkable. No pancreatic ductal dilatation or surrounding inflammatory changes. Spleen: Normal in size without focal abnormality. Adrenals/Urinary Tract: Adrenal glands are unremarkable. Kidneys are normal, without renal calculi, focal lesion, or hydronephrosis. Bladder is unremarkable. Stomach/Bowel: No bowel obstruction or ileus. The appendix is not well visualized. No inflammatory changes to suggest appendicitis. No bowel wall thickening. Vascular/Lymphatic: No significant vascular findings are present. No enlarged abdominal or pelvic lymph nodes. Reproductive: Uterus is unremarkable. There is a 5.7 x 6.1 x 4.6 cm simple cyst within the right adnexa. A 3.4 x 3.2 x 2.5 cm minimally complex cyst is seen within the left adnexa, with several thin septations identified. Other: No free fluid or free intraperitoneal gas. No abdominal wall hernia.  Musculoskeletal: No acute or destructive bony abnormalities. Reconstructed images demonstrate no additional findings. IMPRESSION: 1. Simple 6.1 cm right adnexal cyst and minimally complex 3.4 cm left adnexal cyst. Further evaluation with pelvic ultrasound may be useful given clinical presentation. 2. Otherwise no acute intra-abdominal or intrapelvic process. The appendix, if still present, is not well visualized. Electronically Signed   By: Sharlet Salina M.D.   On: 07/30/2022 22:27   DG Chest Port 1 View  Result Date: 07/30/2022 CLINICAL DATA:  Questionable sepsis - evaluate for abnormality EXAM: PORTABLE CHEST 1 VIEW COMPARISON:  Chest x-ray 10/11/2013 FINDINGS: The heart and mediastinal contours are within normal limits. No focal consolidation. No pulmonary edema. No pleural effusion. No pneumothorax. No acute osseous abnormality. IMPRESSION: No active disease. Electronically Signed   By: Tish Frederickson M.D.   On: 07/30/2022 19:59    Procedures Procedures  {Document cardiac monitor, telemetry assessment procedure when appropriate:1}  Medications Ordered in ED Medications  lactated ringers infusion ( Intravenous New Bag/Given 07/30/22 2223)  ondansetron (ZOFRAN) injection 4 mg (4 mg Intravenous Not Given 07/30/22 2014)  cefTRIAXone (ROCEPHIN) 1 g in sodium chloride 0.9 % 100 mL IVPB (1 g Intravenous New Bag/Given 07/30/22 2330)  ondansetron (ZOFRAN) injection 4 mg (has no administration in time range)  lactated ringers bolus 2,000 mL (2,000 mLs Intravenous New Bag/Given 07/30/22 2014)  fentaNYL (SUBLIMAZE) injection 50 mcg (50 mcg Intravenous Given 07/30/22 2012)  iohexol (OMNIPAQUE) 300 MG/ML solution 100 mL (100 mLs Intravenous Contrast Given 07/30/22 2204)  fentaNYL (SUBLIMAZE) injection 50 mcg (50 mcg Intravenous Given 07/30/22 2230)  doxycycline (VIBRA-TABS) tablet 100 mg (100 mg Oral Given 07/30/22 2339)  metroNIDAZOLE (FLAGYL) tablet 500 mg (500 mg Oral Given 07/30/22 2339)  morphine (PF) 4  MG/ML injection 4 mg (4 mg Intravenous Given 07/30/22 2338)    ED Course/ Medical Decision Making/ A&P   {   Click here for ABCD2, HEART and other calculatorsREFRESH Note before signing :1}                          Medical Decision Making Amount and/or Complexity of Data Reviewed  Labs: ordered. Decision-making details documented in ED Course. Radiology: ordered. Decision-making details documented in ED Course. ECG/medicine tests: ordered. Decision-making details documented in ED Course.  Risk Prescription drug management.   Medical Decision Making:   Carry Weesner is a 22 y.o. female who presented to the ED today with abdominal pain detailed above.    Patient's presentation is complicated by their history of new sexual partner.  Complete initial physical exam performed, notably the patient  was in moderate distress secondary to pain.  She was neurologically intact.  Periumbilical abdominal tenderness but abdomen soft, nondistended.  No CVA tenderness.  Pelvic exam as above with mucopurulent vaginal discharge and CMT. Reviewed and confirmed nursing documentation for past medical history, family history, social history.    Initial Assessment:   With the patient's presentation of abdominal pain, differential diagnosis includes but is not limited to AAA, mesenteric ischemia, appendicitis, diverticulitis, DKA, gastritis, gastroenteritis, AMI, nephrolithiasis, pancreatitis, peritonitis, adrenal insufficiency, intestinal ischemia, constipation, UTI, SBO/LBO, splenic rupture, biliary disease, IBD, IBS, PUD, hepatitis, STD, ovarian/testicular torsion, electrolyte disturbance, DKA, dehydration, acute kidney injury, renal failure, cholecystitis, cholelithiasis, choledocholithiasis, abdominal pain of  unknown etiology, pregnancy, incomplete abortion, septic abortion, threatened abortion, ectopic pregnancy, PID.   Initial Plan:  Screening labs including CBC and Metabolic panel to evaluate for  infectious or metabolic etiology of disease.  Lipase to evaluate for pancreatitis Urinalysis with reflex culture ordered to evaluate for UTI or relevant urologic/nephrologic pathology.  CT abd/pelvis to evaluate for intra-abdominal pathology EKG to evaluate for cardiac pathology Sepsis orders with borderline BP STD testing Symptomatic management Objective evaluation as reviewed   Initial Study Results:   Laboratory  All laboratory results reviewed without evidence of clinically relevant pathology.   Exceptions include: ***   EKG EKG was reviewed independently. NSR. No acute ST-T changes. No STEMI.   Radiology:  All images reviewed independently. Agree with radiology report at this time.   CT ABDOMEN PELVIS W CONTRAST  Result Date: 07/30/2022 CLINICAL DATA:  Generalized abdominal pain since yesterday EXAM: CT ABDOMEN AND PELVIS WITH CONTRAST TECHNIQUE: Multidetector CT imaging of the abdomen and pelvis was performed using the standard protocol following bolus administration of intravenous contrast. RADIATION DOSE REDUCTION: This exam was performed according to the departmental dose-optimization program which includes automated exposure control, adjustment of the mA and/or kV according to patient size and/or use of iterative reconstruction technique. CONTRAST:  OMNIPAQUE IOHEXOL 300 MG/ML  SOLN COMPARISON:  None Available. FINDINGS: Lower chest: No acute pleural or parenchymal lung disease. Hepatobiliary: No focal liver abnormality is seen. No gallstones, gallbladder wall thickening, or biliary dilatation. Pancreas: Unremarkable. No pancreatic ductal dilatation or surrounding inflammatory changes. Spleen: Normal in size without focal abnormality. Adrenals/Urinary Tract: Adrenal glands are unremarkable. Kidneys are normal, without renal calculi, focal lesion, or hydronephrosis. Bladder is unremarkable. Stomach/Bowel: No bowel obstruction or ileus. The appendix is not well visualized. No  inflammatory changes to suggest appendicitis. No bowel wall thickening. Vascular/Lymphatic: No significant vascular findings are present. No enlarged abdominal or pelvic lymph nodes. Reproductive: Uterus is unremarkable. There is a 5.7 x 6.1 x 4.6 cm simple cyst within the right adnexa. A 3.4 x 3.2 x 2.5 cm minimally complex cyst is seen within the left adnexa, with several thin septations identified. Other: No free fluid or free intraperitoneal gas. No abdominal wall hernia. Musculoskeletal: No acute or destructive bony abnormalities. Reconstructed images demonstrate no additional findings. IMPRESSION: 1. Simple 6.1 cm right adnexal cyst and minimally complex 3.4 cm left adnexal  cyst. Further evaluation with pelvic ultrasound may be useful given clinical presentation. 2. Otherwise no acute intra-abdominal or intrapelvic process. The appendix, if still present, is not well visualized. Electronically Signed   By: Sharlet Salina M.D.   On: 07/30/2022 22:27   DG Chest Port 1 View  Result Date: 07/30/2022 CLINICAL DATA:  Questionable sepsis - evaluate for abnormality EXAM: PORTABLE CHEST 1 VIEW COMPARISON:  Chest x-ray 10/11/2013 FINDINGS: The heart and mediastinal contours are within normal limits. No focal consolidation. No pulmonary edema. No pleural effusion. No pneumothorax. No acute osseous abnormality. IMPRESSION: No active disease. Electronically Signed   By: Tish Frederickson M.D.   On: 07/30/2022 19:59      Consults: Case discussed with ***.   Final Assessment and Plan:   ***    Clinical Impression:  1. Pelvic inflammatory disease   2. Adnexal cyst      Data Unavailable    {Document critical care time when appropriate:1} {Document review of labs and clinical decision tools ie heart score, Chads2Vasc2 etc:1}  {Document your independent review of radiology images, and any outside records:1} {Document your discussion with family members, caretakers, and with consultants:1} {Document social  determinants of health affecting pt's care:1} {Document your decision making why or why not admission, treatments were needed:1} Final Clinical Impression(s) / ED Diagnoses Final diagnoses:  Pelvic inflammatory disease  Adnexal cyst    Rx / DC Orders ED Discharge Orders          Ordered    doxycycline (VIBRAMYCIN) 100 MG capsule  2 times daily        07/30/22 2329    metroNIDAZOLE (FLAGYL) 500 MG tablet  2 times daily        07/30/22 2329    ondansetron (ZOFRAN) 4 MG tablet  Every 8 hours PRN        07/30/22 2329

## 2022-07-30 NOTE — Progress Notes (Addendum)
Notified provider of need to order antibiotics.  2225: Per MD-> patient neurologically improved and holding off on antibiotics for now.  Antibiotics have now been ordered.

## 2022-07-31 LAB — GC/CHLAMYDIA PROBE AMP (~~LOC~~) NOT AT ARMC
Chlamydia: POSITIVE — AB
Comment: NEGATIVE
Comment: NORMAL
Neisseria Gonorrhea: POSITIVE — AB

## 2022-07-31 LAB — URINALYSIS, ROUTINE W REFLEX MICROSCOPIC
Bacteria, UA: NONE SEEN
Bilirubin Urine: NEGATIVE
Glucose, UA: NEGATIVE mg/dL
Hgb urine dipstick: NEGATIVE
Ketones, ur: 5 mg/dL — AB
Nitrite: NEGATIVE
Protein, ur: NEGATIVE mg/dL
Specific Gravity, Urine: 1.026 (ref 1.005–1.030)
pH: 6 (ref 5.0–8.0)

## 2022-07-31 LAB — RPR: RPR Ser Ql: NONREACTIVE

## 2022-07-31 LAB — CULTURE, BLOOD (ROUTINE X 2)

## 2022-07-31 LAB — HIV ANTIBODY (ROUTINE TESTING W REFLEX): HIV Screen 4th Generation wRfx: NONREACTIVE

## 2022-07-31 NOTE — ED Provider Notes (Incomplete)
[place] EMERGENCY DEPARTMENT AT [facility] Provider Note   CSN: [id number] Arrival date & time: 07/30/22  1920     History {Add pertinent medical, surgical, social history, OB history to HPI:1} Chief Complaint  Patient presents with  . Abdominal Pain    Anita Hayes is a 22 y.o. female with past medical history of sickle cell trait who presents to the ED complaining of abdominal pain that started yesterday.  She says that she has been taking ibuprofen at home without relief of pain.  States that pain has become severe in nature and she has began to feel generally weak.  When she arrives, nursing staff stated that she was lethargic so I immediately went to bedside.  On initial evaluation, patient did appear uncomfortable which she stated was secondary to pain but was alert and oriented and answering questions appropriately.  She denies recent fever, dysuria, hematuria, vaginal bleeding or discharge, chest pain, shortness of breath, cough, congestion, nausea, vomiting, or diarrhea.  She states that her menstrual cycle just ended and there is not a possibility she is pregnant.  Denies concern for STDs.  Denies any known sick contacts.  Denies history of this pain.  No previous abdominal surgeries.      Home Medications Prior to Admission medications   Medication Sig Start Date End Date Taking? Authorizing Provider  doxycycline (VIBRAMYCIN) 100 MG capsule Take 1 capsule (100 mg total) by mouth 2 (two) times daily for 14 days. 07/30/22 08/13/22 Yes Tecia Cinnamon L, PA-C  ibuprofen (ADVIL) 200 MG tablet Take 200 mg by mouth every 6 (six) hours as needed for mild pain.   Yes [provider]  metroNIDAZOLE (FLAGYL) 500 MG tablet Take 1 tablet (500 mg total) by mouth 2 (two) times daily for 14 days. 07/30/22 08/13/22 Yes Kalee Broxton L, PA-C  ondansetron (ZOFRAN) 4 MG tablet Take 1 tablet (4 mg total) by mouth every 8 (eight) hours as needed for up to 5 days for nausea or  vomiting. 07/30/22 08/04/22 Yes Alisandra Son L, PA-C  XULANE 150-35 MCG/24HR transdermal patch PLACE 1 PATCH ONTO THE SKIN ONCE A WEEK 06/23/22  Yes Lilland, Alana, DO  Prenatal Vit-Fe Fumarate-FA (PRENATAL VITAMIN) 27-0.8 MG TABS Take 1 tablet by mouth daily. Patient not taking: Reported on 07/30/2022 11/08/21   Lilland, Alana, DO  sertraline (ZOLOFT) 50 MG tablet TAKE 1 TABLET BY MOUTH EVERY DAY Patient not taking: Reported on 07/30/2022 10/16/21   Evelena Leyden, DO      Allergies    Patient has no known allergies.    Review of Systems   Review of Systems  All other systems reviewed and are negative.   Physical Exam Updated Vital Signs BP 138/78   Pulse 99   Temp 98.1 F (36.7 C) (Oral)   Resp (!) 26   Ht 5\' 3"  (1.6 m)   Wt 98 kg   SpO2 100%   BMI 38.27 kg/m  Physical Exam Vitals and nursing note reviewed. Exam conducted with a chaperone present.  Constitutional:      General: She is in acute distress.     Appearance: Normal appearance. She is ill-appearing. She is not toxic-appearing or diaphoretic.  HENT:     Head: Normocephalic and atraumatic.     Mouth/Throat:     Comments: Dry mucous membranes Eyes:     General: No scleral icterus.    Conjunctiva/sclera: Conjunctivae normal.  Cardiovascular:     Rate and Rhythm: Normal rate and regular rhythm.  Heart sounds: No murmur heard. Pulmonary:     Effort: Pulmonary effort is normal. No respiratory distress.     Breath sounds: Normal breath sounds. No stridor. No wheezing, rhonchi or rales.  Abdominal:     General: Abdomen is flat.     Palpations: Abdomen is soft. There is no pulsatile mass.     Tenderness: There is abdominal tenderness in the periumbilical area. There is guarding. There is no right CVA tenderness, left CVA tenderness or rebound. Positive signs include Murphy's sign. Negative signs include Rovsing's sign and McBurney's sign.  Genitourinary:    Pubic Area: No rash.      Labia:        Right: No rash,  tenderness or lesion.        Left: No rash, tenderness or lesion.      Vagina: Vaginal discharge (thick, off [place] mucopurulent, foul smelling) present. No erythema or bleeding.     Cervix: Cervical motion tenderness present. No friability or cervical bleeding.     Uterus: Not enlarged.      Adnexa:        Right: No tenderness or fullness.         Left: No tenderness or fullness.       Comments: Performed with primary RN, Kyla Balzarine, as chaperone Musculoskeletal:        General: Normal range of motion.     Cervical back: Neck supple.     Right lower leg: No edema.     Left lower leg: No edema.  Lymphadenopathy:     Lower Body: No right inguinal adenopathy. No left inguinal adenopathy.  Skin:    General: Skin is warm and dry.     Capillary Refill: Capillary refill takes less than 2 seconds.     Findings: No rash.  Neurological:     General: No focal deficit present.     Mental Status: She is alert and oriented to person, place, and time. Mental status is at baseline.  Psychiatric:        Mood and Affect: Affect is tearful.     ED Results / Procedures / Treatments   Labs (all labs ordered are listed, but only abnormal results are displayed) Labs Reviewed  COMPREHENSIVE METABOLIC PANEL - Abnormal; Notable for the following components:      Result Value   Sodium 133 (*)    CO2 20 (*)    Glucose, Bld 107 (*)    Calcium 8.4 (*)    Total Bilirubin 1.7 (*)    All other components within normal limits  CBC - Abnormal; Notable for the following components:   WBC 14.3 (*)    Hemoglobin 11.0 (*)    HCT 35.4 (*)    MCV 72.4 (*)    MCH 22.5 (*)    All other components within normal limits  PROTIME-INR - Abnormal; Notable for the following components:   Prothrombin Time 15.4 (*)    All other components within normal limits  RESP PANEL BY RT-PCR (RSV, FLU A&B, COVID)  RVPGX2  CULTURE, BLOOD (ROUTINE X 2)  CULTURE, BLOOD (ROUTINE X 2)  WET PREP, GENITAL  LIPASE, BLOOD  LACTIC ACID,  PLASMA  LACTIC ACID, PLASMA  APTT  HCG, SERUM, QUALITATIVE  URINALYSIS, ROUTINE W REFLEX MICROSCOPIC  HIV ANTIBODY (ROUTINE TESTING W REFLEX)  RPR  GC/CHLAMYDIA PROBE AMP ([employer]) NOT AT [facility]    EKG None  Radiology CT ABDOMEN PELVIS W CONTRAST  Result Date: 07/30/2022 CLINICAL DATA:  Generalized abdominal pain since yesterday EXAM: CT ABDOMEN AND PELVIS WITH CONTRAST TECHNIQUE: Multidetector CT imaging of the abdomen and pelvis was performed using the standard protocol following bolus administration of intravenous contrast. RADIATION DOSE REDUCTION: This exam was performed according to the departmental dose-optimization program which includes automated exposure control, adjustment of the mA and/or kV according to patient size and/or use of iterative reconstruction technique. CONTRAST:  OMNIPAQUE IOHEXOL 300 MG/ML  SOLN COMPARISON:  None Available. FINDINGS: Lower chest: No acute pleural or parenchymal lung disease. Hepatobiliary: No focal liver abnormality is seen. No gallstones, gallbladder wall thickening, or biliary dilatation. Pancreas: Unremarkable. No pancreatic ductal dilatation or surrounding inflammatory changes. Spleen: Normal in size without focal abnormality. Adrenals/Urinary Tract: Adrenal glands are unremarkable. Kidneys are normal, without renal calculi, focal lesion, or hydronephrosis. Bladder is unremarkable. Stomach/Bowel: No bowel obstruction or ileus. The appendix is not well visualized. No inflammatory changes to suggest appendicitis. No bowel wall thickening. Vascular/Lymphatic: No significant vascular findings are present. No enlarged abdominal or pelvic lymph nodes. Reproductive: Uterus is unremarkable. There is a 5.7 x 6.1 x 4.6 cm simple cyst within the right adnexa. A 3.4 x 3.2 x 2.5 cm minimally complex cyst is seen within the left adnexa, with several thin septations identified. Other: No free fluid or free intraperitoneal gas. No abdominal wall hernia.  Musculoskeletal: No acute or destructive bony abnormalities. Reconstructed images demonstrate no additional findings. IMPRESSION: 1. Simple 6.1 cm right adnexal cyst and minimally complex 3.4 cm left adnexal cyst. Further evaluation with pelvic ultrasound may be useful given clinical presentation. 2. Otherwise no acute intra-abdominal or intrapelvic process. The appendix, if still present, is not well visualized. Electronically Signed   By: Sharlet Salina M.D.   On: 07/30/2022 22:27   DG Chest Port 1 View  Result Date: 07/30/2022 CLINICAL DATA:  Questionable sepsis - evaluate for abnormality EXAM: PORTABLE CHEST 1 VIEW COMPARISON:  Chest x-ray 10/11/2013 FINDINGS: The heart and mediastinal contours are within normal limits. No focal consolidation. No pulmonary edema. No pleural effusion. No pneumothorax. No acute osseous abnormality. IMPRESSION: No active disease. Electronically Signed   By: Tish Frederickson M.D.   On: 07/30/2022 19:59    Procedures Procedures  {Document cardiac monitor, telemetry assessment procedure when appropriate:1}  Medications Ordered in ED Medications  lactated ringers infusion ( Intravenous New Bag/Given 07/30/22 2223)  ondansetron (ZOFRAN) injection 4 mg (4 mg Intravenous Not Given 07/30/22 2014)  cefTRIAXone (ROCEPHIN) 1 g in sodium chloride 0.9 % 100 mL IVPB (1 g Intravenous New Bag/Given 07/30/22 2330)  ondansetron (ZOFRAN) injection 4 mg (has no administration in time range)  lactated ringers bolus 2,000 mL (2,000 mLs Intravenous New Bag/Given 07/30/22 2014)  fentaNYL (SUBLIMAZE) injection 50 mcg (50 mcg Intravenous Given 07/30/22 2012)  iohexol (OMNIPAQUE) 300 MG/ML solution 100 mL (100 mLs Intravenous Contrast Given 07/30/22 2204)  fentaNYL (SUBLIMAZE) injection 50 mcg (50 mcg Intravenous Given 07/30/22 2230)  doxycycline (VIBRA-TABS) tablet 100 mg (100 mg Oral Given 07/30/22 2339)  metroNIDAZOLE (FLAGYL) tablet 500 mg (500 mg Oral Given 07/30/22 2339)  morphine (PF) 4  MG/ML injection 4 mg (4 mg Intravenous Given 07/30/22 2338)    ED Course/ Medical Decision Making/ A&P   {   Click here for ABCD2, HEART and other calculatorsREFRESH Note before signing :1}                          Medical Decision Making Amount and/or Complexity of Data Reviewed  Labs: ordered. Decision-making details documented in ED Course. Radiology: ordered. Decision-making details documented in ED Course. ECG/medicine tests: ordered. Decision-making details documented in ED Course.  Risk Prescription drug management.   Medical Decision Making:   Shahira Fiske is a 22 y.o. female who presented to the ED today with abdominal pain detailed above.    Patient's presentation is complicated by their history of new sexual partner.  Complete initial physical exam performed, notably the patient  was in moderate distress secondary to pain.  She was neurologically intact.  Periumbilical abdominal tenderness but abdomen soft, nondistended.  No CVA tenderness.  Pelvic exam as above with mucopurulent vaginal discharge and CMT. Reviewed and confirmed nursing documentation for past medical history, family history, social history.    Initial Assessment:   With the patient's presentation of abdominal pain, differential diagnosis includes but is not limited to AAA, mesenteric ischemia, appendicitis, diverticulitis, DKA, gastritis, gastroenteritis, AMI, nephrolithiasis, pancreatitis, peritonitis, adrenal insufficiency, intestinal ischemia, constipation, UTI, SBO/LBO, splenic rupture, biliary disease, IBD, IBS, PUD, hepatitis, STD, ovarian/testicular torsion, electrolyte disturbance, DKA, dehydration, acute kidney injury, renal failure, cholecystitis, cholelithiasis, choledocholithiasis, abdominal pain of  unknown etiology, pregnancy, incomplete abortion, septic abortion, threatened abortion, ectopic pregnancy, PID.   Initial Plan:  Screening labs including CBC and Metabolic panel to evaluate for  infectious or metabolic etiology of disease.  Lipase to evaluate for pancreatitis Urinalysis with reflex culture ordered to evaluate for UTI or relevant urologic/nephrologic pathology.  CT abd/pelvis to evaluate for intra-abdominal pathology EKG to evaluate for cardiac pathology Sepsis orders with borderline BP STD testing [place], declined transvaginal portion Symptomatic management Objective evaluation as reviewed   Initial Study Results:   Laboratory  All laboratory results reviewed without evidence of clinically relevant pathology.   Exceptions include: ***   EKG EKG was reviewed independently. NSR. No acute ST-T changes. No STEMI.   Radiology:  All images reviewed independently. Agree with radiology report at this time.   CT ABDOMEN PELVIS W CONTRAST  Result Date: 07/30/2022 CLINICAL DATA:  Generalized abdominal pain since yesterday EXAM: CT ABDOMEN AND PELVIS WITH CONTRAST TECHNIQUE: Multidetector CT imaging of the abdomen and pelvis was performed using the standard protocol following bolus administration of intravenous contrast. RADIATION DOSE REDUCTION: This exam was performed according to the departmental dose-optimization program which includes automated exposure control, adjustment of the mA and/or kV according to patient size and/or use of iterative reconstruction technique. CONTRAST:  OMNIPAQUE IOHEXOL 300 MG/ML  SOLN COMPARISON:  None Available. FINDINGS: Lower chest: No acute pleural or parenchymal lung disease. Hepatobiliary: No focal liver abnormality is seen. No gallstones, gallbladder wall thickening, or biliary dilatation. Pancreas: Unremarkable. No pancreatic ductal dilatation or surrounding inflammatory changes. Spleen: Normal in size without focal abnormality. Adrenals/Urinary Tract: Adrenal glands are unremarkable. Kidneys are normal, without renal calculi, focal lesion, or hydronephrosis. Bladder is unremarkable. Stomach/Bowel: No bowel obstruction or ileus. The  appendix is not well visualized. No inflammatory changes to suggest appendicitis. No bowel wall thickening. Vascular/Lymphatic: No significant vascular findings are present. No enlarged abdominal or pelvic lymph nodes. Reproductive: Uterus is unremarkable. There is a 5.7 x 6.1 x 4.6 cm simple cyst within the right adnexa. A 3.4 x 3.2 x 2.5 cm minimally complex cyst is seen within the left adnexa, with several thin septations identified. Other: No free fluid or free intraperitoneal gas. No abdominal wall hernia. Musculoskeletal: No acute or destructive bony abnormalities. Reconstructed images demonstrate no additional findings. IMPRESSION: 1. Simple 6.1 cm right adnexal cyst and minimally complex  3.4 cm left adnexal cyst. Further evaluation with pelvic ultrasound may be useful given clinical presentation. 2. Otherwise no acute intra-abdominal or intrapelvic process. The appendix, if still present, is not well visualized. Electronically Signed   By: Sharlet Salina M.D.   On: 07/30/2022 22:27   DG Chest Port 1 View  Result Date: 07/30/2022 CLINICAL DATA:  Questionable sepsis - evaluate for abnormality EXAM: PORTABLE CHEST 1 VIEW COMPARISON:  Chest x-ray 10/11/2013 FINDINGS: The heart and mediastinal contours are within normal limits. No focal consolidation. No pulmonary edema. No pleural effusion. No pneumothorax. No acute osseous abnormality. IMPRESSION: No active disease. Electronically Signed   By: Tish Frederickson M.D.   On: 07/30/2022 19:59      Consults: Case discussed with ***.   Final Assessment and Plan:   ***    Clinical Impression:  1. Pelvic inflammatory disease   2. Adnexal cyst      Data Unavailable    {Document critical care time when appropriate:1} {Document review of labs and clinical decision tools ie heart score, Chads2Vasc2 etc:1}  {Document your independent review of radiology images, and any outside records:1} {Document your discussion with family members, caretakers, and  with consultants:1} {Document social determinants of health affecting pt's care:1} {Document your decision making why or why not admission, treatments were needed:1} Final Clinical Impression(s) / ED Diagnoses Final diagnoses:  Pelvic inflammatory disease  Adnexal cyst    Rx / DC Orders ED Discharge Orders          Ordered    doxycycline (VIBRAMYCIN) 100 MG capsule  2 times daily        07/30/22 2329    metroNIDAZOLE (FLAGYL) 500 MG tablet  2 times daily        07/30/22 2329    ondansetron (ZOFRAN) 4 MG tablet  Every 8 hours PRN        07/30/22 2329

## 2022-07-31 NOTE — ED Provider Notes (Signed)
Care of pain and handed off to me by Ysidro Evert, PA-C.  Please see her note for workup.  Briefly, 22 year old female who presented to the emergency department with severe abdominal pain.  Also complaining of generalized weakness.  Is having new vaginal discharge and partner. Physical Exam  BP 138/78   Pulse 99   Temp 98.1 F (36.7 C) (Oral)   Resp (!) 26   Ht 5\' 3"  (1.6 m)   Wt 98 kg   SpO2 100%   BMI 38.27 kg/m   Physical Exam Vitals and nursing note reviewed.  HENT:     Head: Normocephalic and atraumatic.  Eyes:     General: No scleral icterus. Pulmonary:     Effort: Pulmonary effort is normal. No respiratory distress.  Skin:    Findings: No rash.  Neurological:     General: No focal deficit present.     Mental Status: She is alert.  Psychiatric:        Mood and Affect: Mood normal.        Behavior: Behavior normal.        Thought Content: Thought content normal.        Judgment: Judgment normal.     Procedures  Procedures  ED Course / MDM    Medical Decision Making Amount and/or Complexity of Data Reviewed Labs: ordered. Radiology: ordered. ECG/medicine tests: ordered.  Risk Prescription drug management.   Per previous provider on initial arrival patient was lethargic, hypotensive and a sepsis workup was initiated.  She had IV fluids which improved her blood pressure.  Labs showed a leukocytosis of 14.3, clue cells on her wet prep. Lactic is negative Chest x-ray negative CT abdomen pelvis showed adnexal cyst.  Given the new vaginal discharge a pelvic was performed with mucopurulent discharge.  She received pelvic ultrasound but declined the transvaginal portion.  No TOA was identified.  Patient received antibiotic coverage here with Rocephin, Flagyl and Doxy.  She is still feeling nauseated and so is being given IV Zofran and then will p.o. trial with hopeful disposition home.  0121: patient passed PO trial. Will discharge home with antibiotics, zofran  and strict return precautions.  The patient has been appropriately medically screened and/or stabilized in the ED. I have low suspicion for any other emergent medical condition which would require further screening, evaluation or treatment in the ED or require inpatient management. At time of discharge the patient is hemodynamically stable and in no acute distress. I have discussed work-up results and diagnosis with patient and answered all questions. Patient is agreeable with discharge plan. We discussed strict return precautions for returning to the emergency department and they verbalized understanding.      Cristopher Peru, PA-C 07/31/22 0121    Shon Baton, MD 08/01/22 [id number]

## 2022-08-01 LAB — CULTURE, BLOOD (ROUTINE X 2)

## 2022-08-04 LAB — CULTURE, BLOOD (ROUTINE X 2)
Culture: NO GROWTH
Culture: NO GROWTH
Special Requests: ADEQUATE

## 2022-09-09 ENCOUNTER — Ambulatory Visit: Payer: Medicaid Other | Admitting: Family Medicine

## 2022-09-23 ENCOUNTER — Ambulatory Visit: Payer: Medicaid Other | Admitting: Student

## 2022-10-09 IMAGING — US US MFM UA CORD DOPPLER
1 series · 16 of 25 positions shown · non-contrast
Comparison: none

[Series 1: us mfm ua cord doppler · 25 acquisitions, 16 frames shown]
[im 1/25]
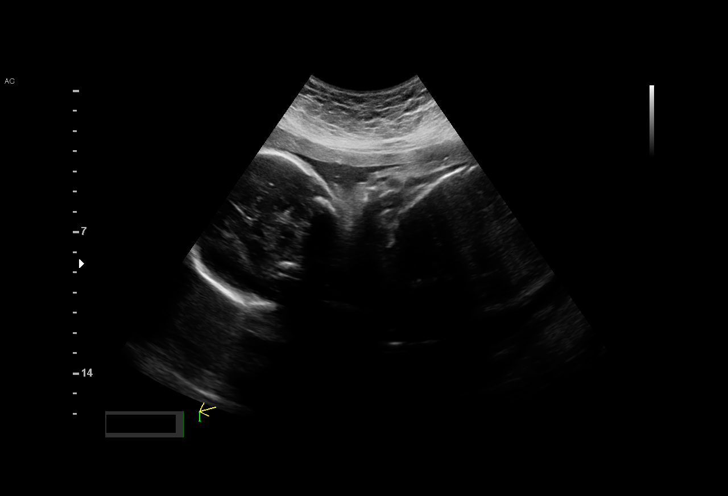
[im 3/25]
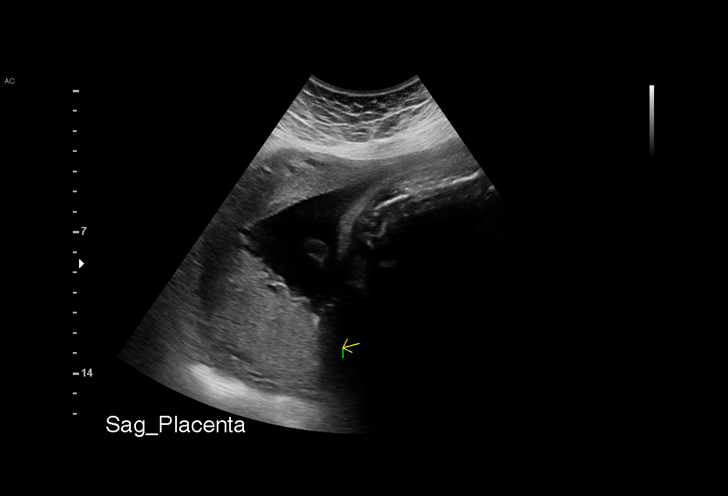
[im 4/25]
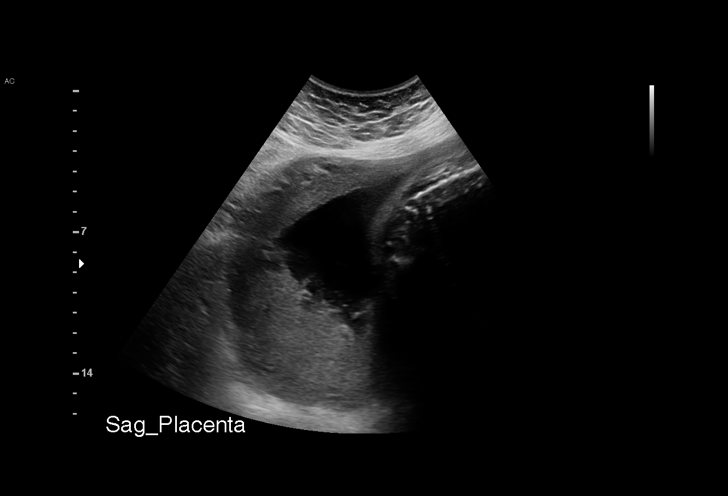
[im 6/25]
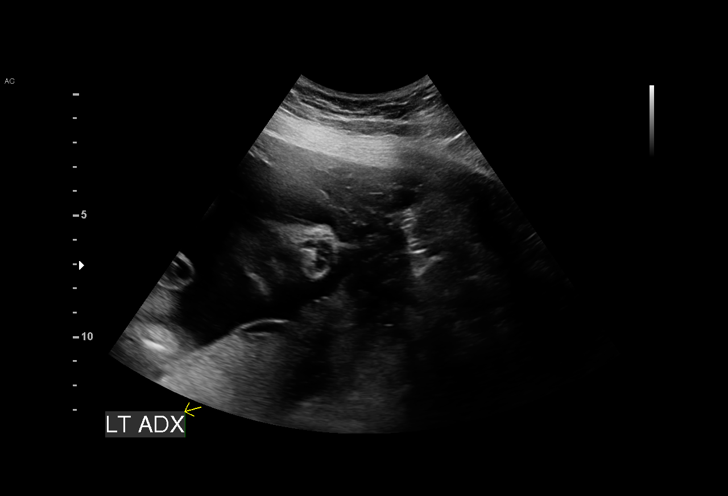
[im 8/25]
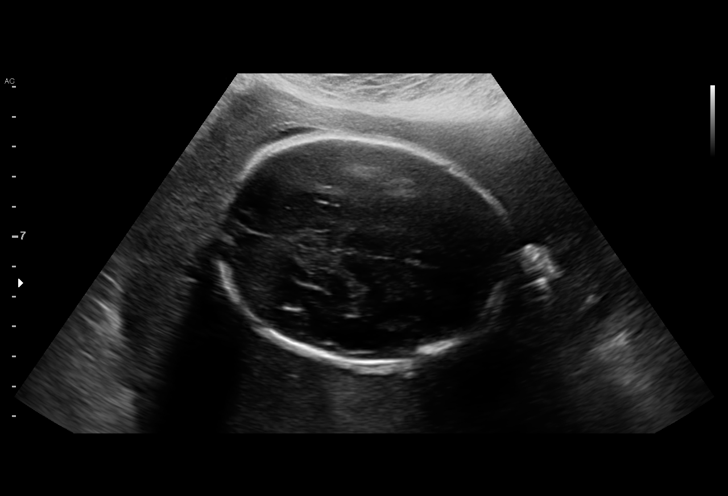
[im 9/25]
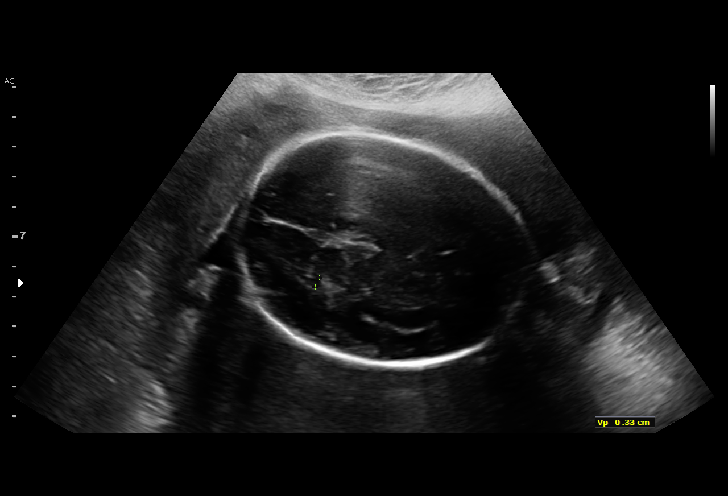
[im 11/25]
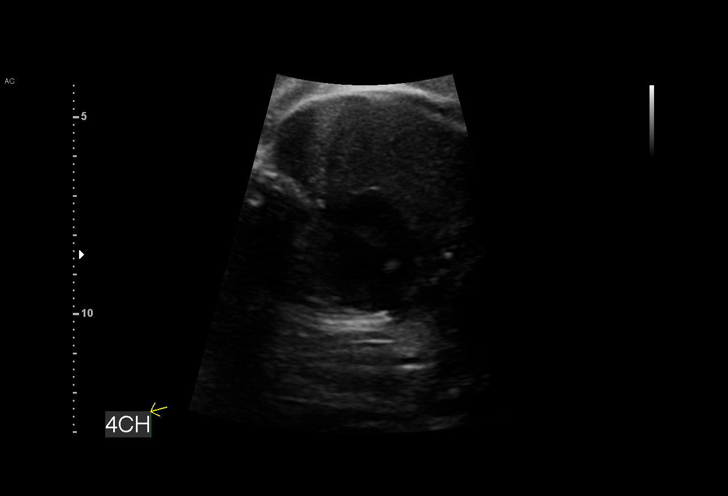
[im 12/25]
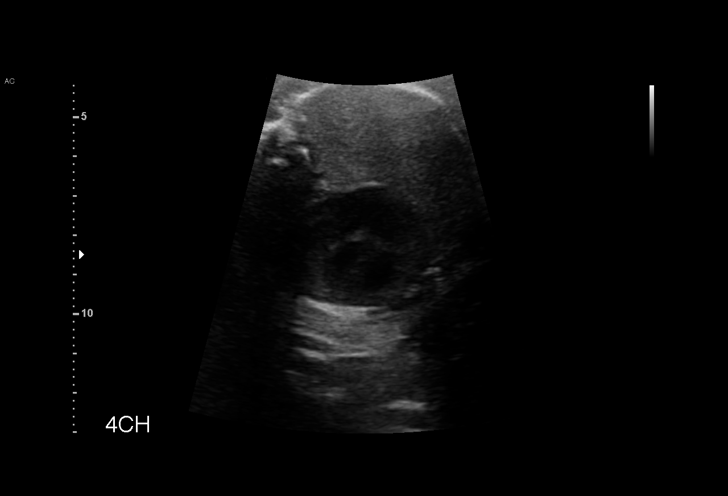
[im 14/25]
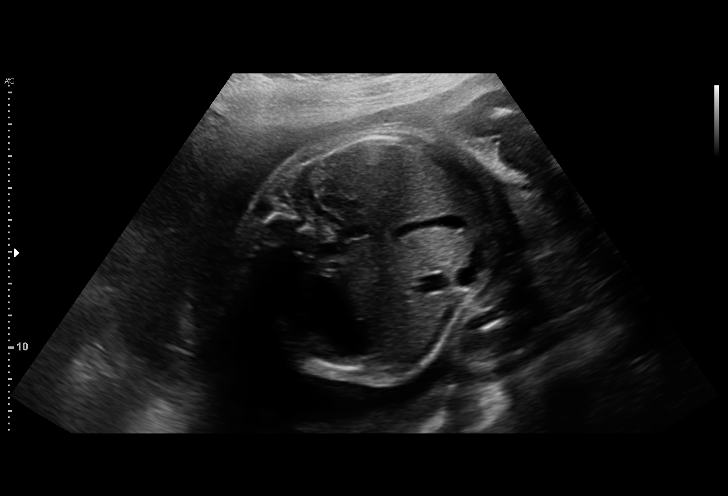
[im 15/25]
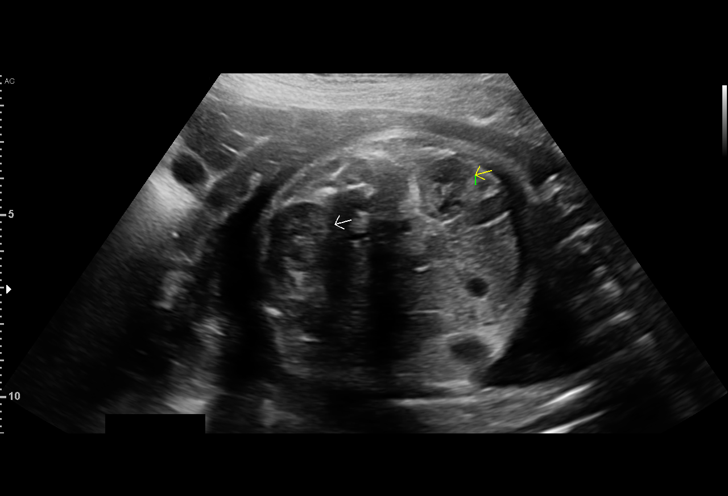
[im 17/25]
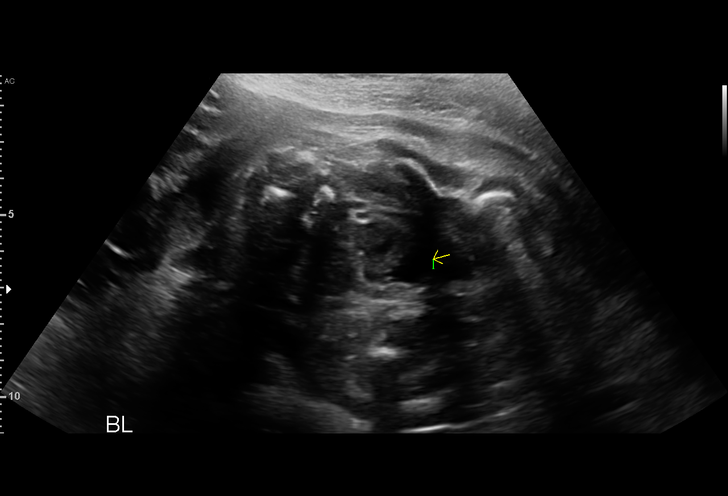
[im 18/25]
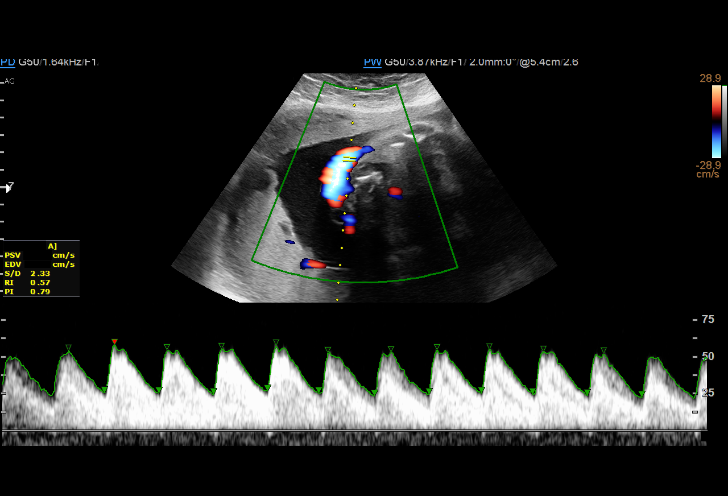
[im 20/25]
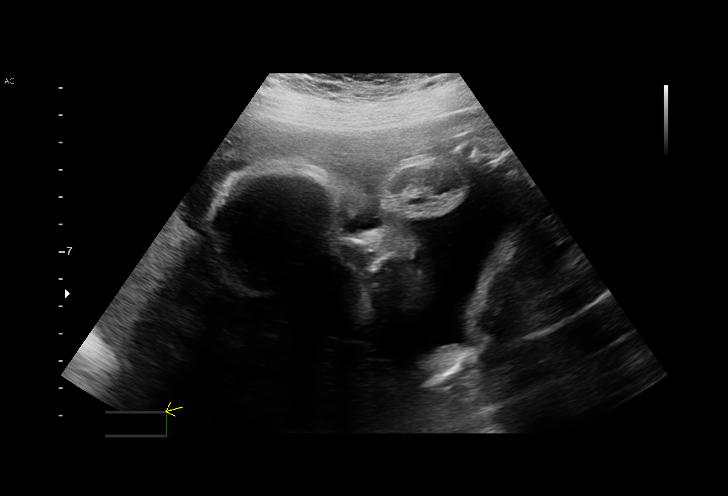
[im 22/25]
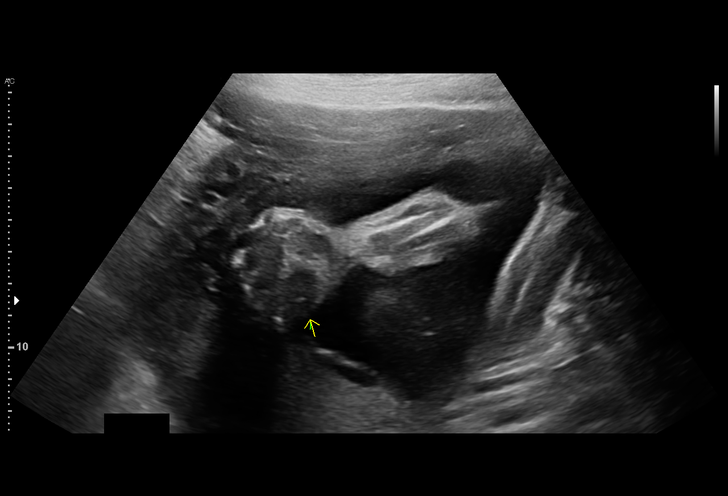
[im 23/25]
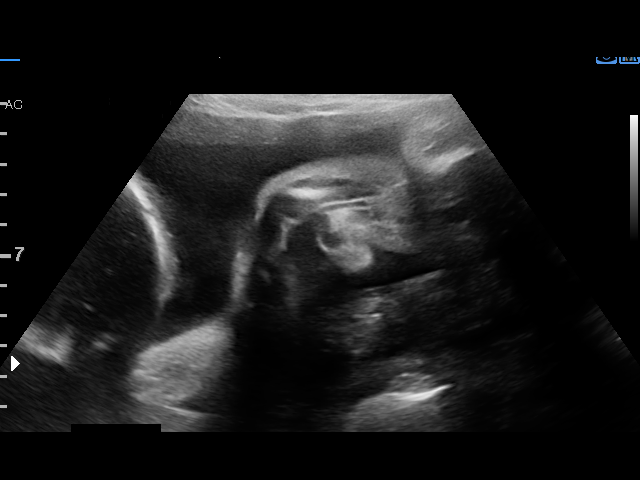
[im 25/25]
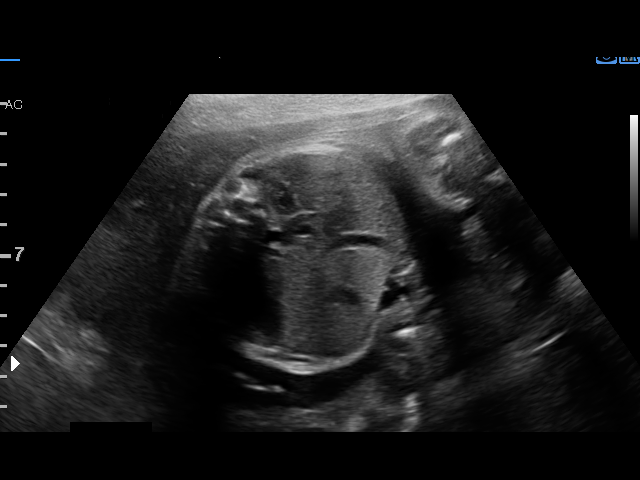

[16 of 25 positions shown; findings below may reference images not displayed]

Indications

 Maternal care for known or suspected poor
 fetal growth, third trimester, not applicable or
 unspecified IUGR
 Late to prenatal care, third trimester
 Obesity complicating pregnancy, third
 trimester (pregravid BMI 40)
 Poor obstetric history: Previous
 preeclampsia / eclampsia/gestational HTN
 Encounter for other antenatal screening
 follow-up
 Genetic carrier (Blade Aujla & Sickle Cell
 Disease)
 Low Risk NIPS

## 2022-10-28 ENCOUNTER — Ambulatory Visit: Payer: Medicaid Other | Admitting: Family Medicine

## 2022-11-06 IMAGING — US US MFM UA CORD DOPPLER
1 series · 16 of 20 positions shown · non-contrast
Comparison: none

[Series 1: us mfm ua cord doppler · 20 acquisitions, 16 frames shown]
[im 1/20]
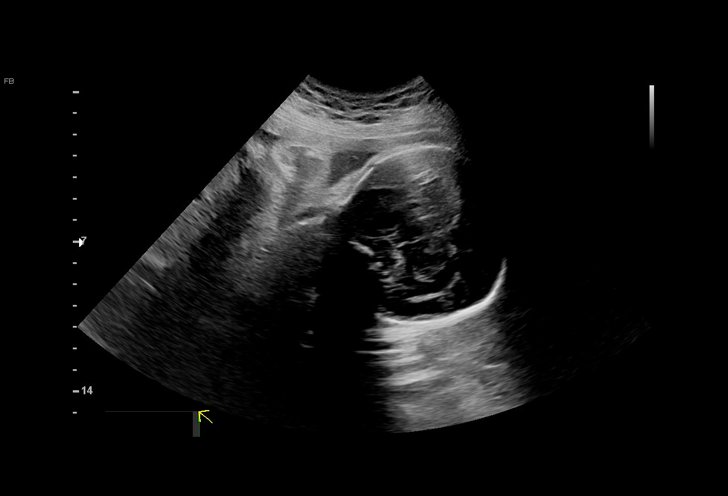
[im 2/20]
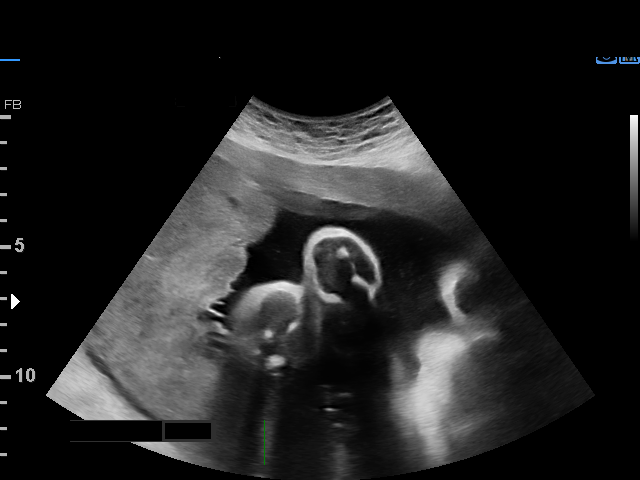
[im 4/20]
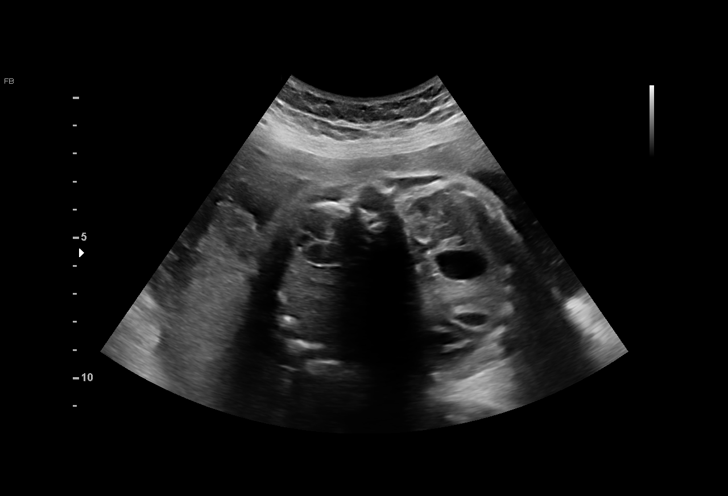
[im 5/20]
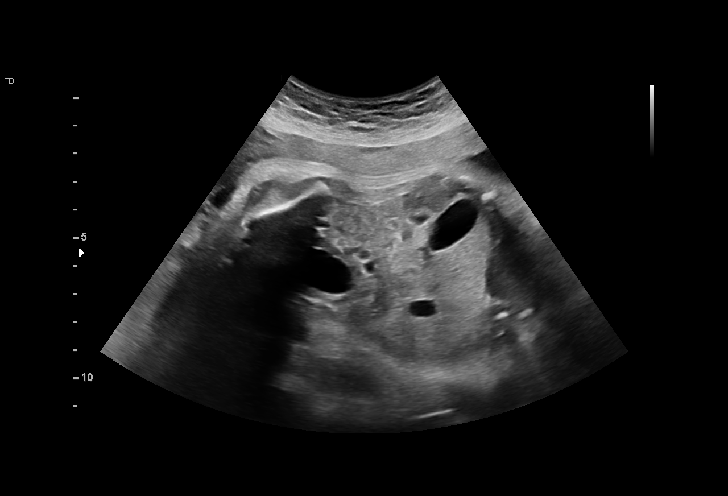
[im 6/20]
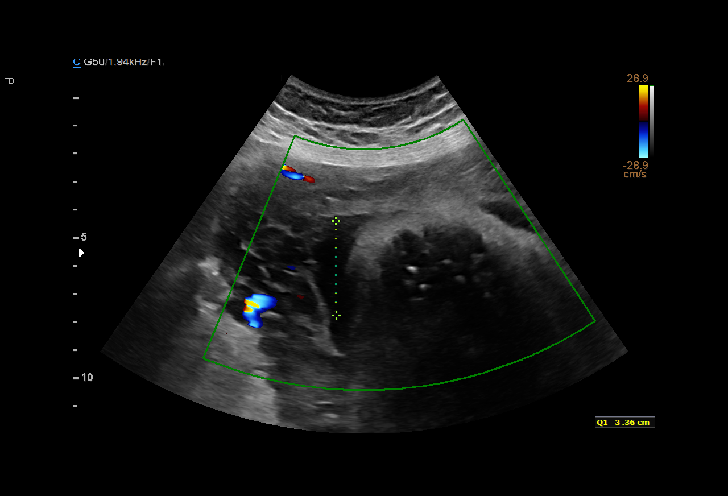
[im 7/20]
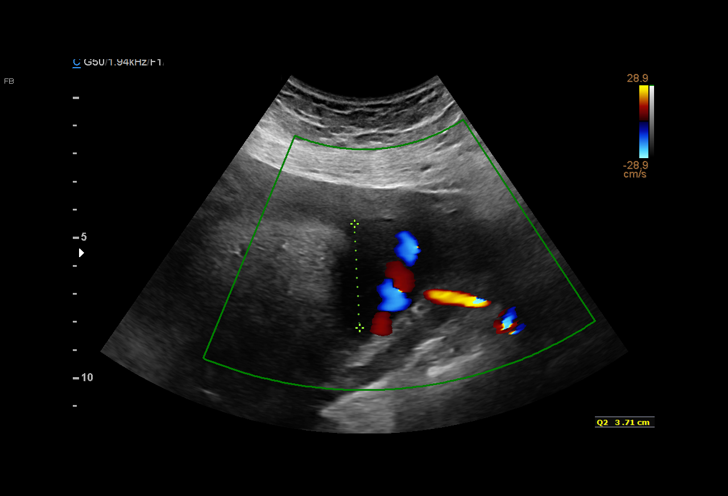
[im 9/20]
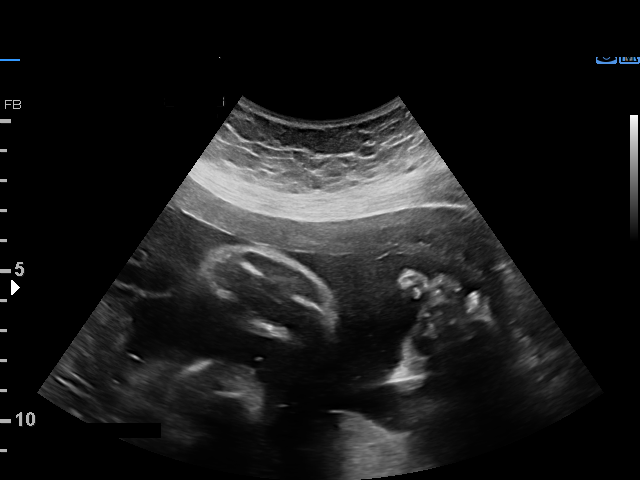
[im 10/20]
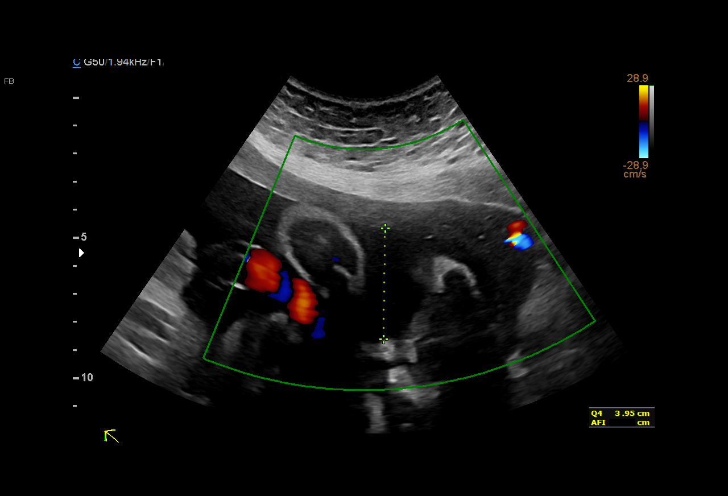
[im 11/20]
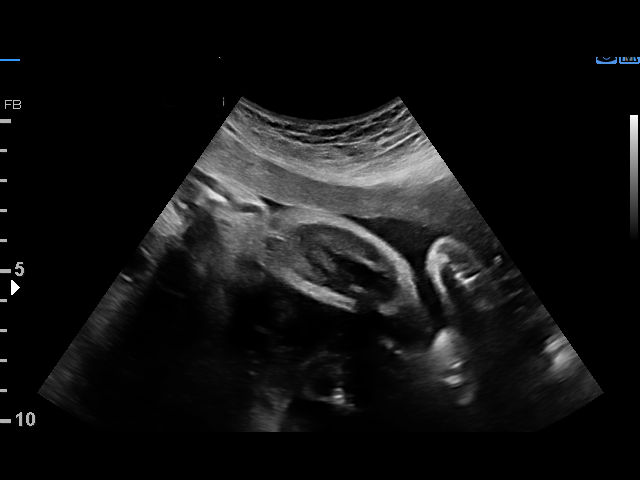
[im 12/20]
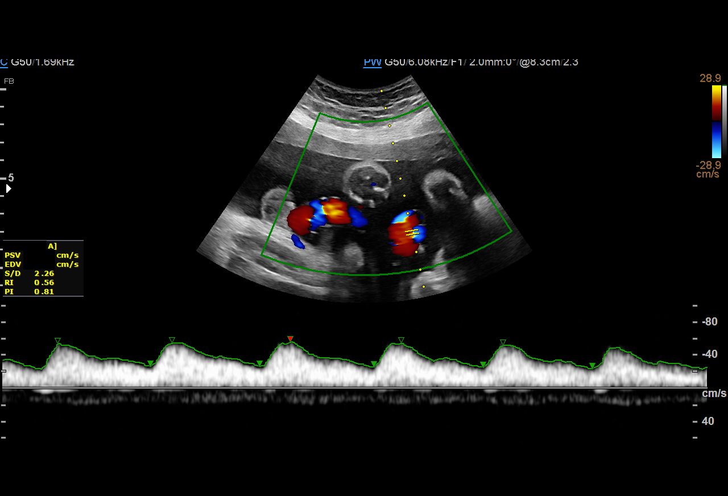
[im 14/20]
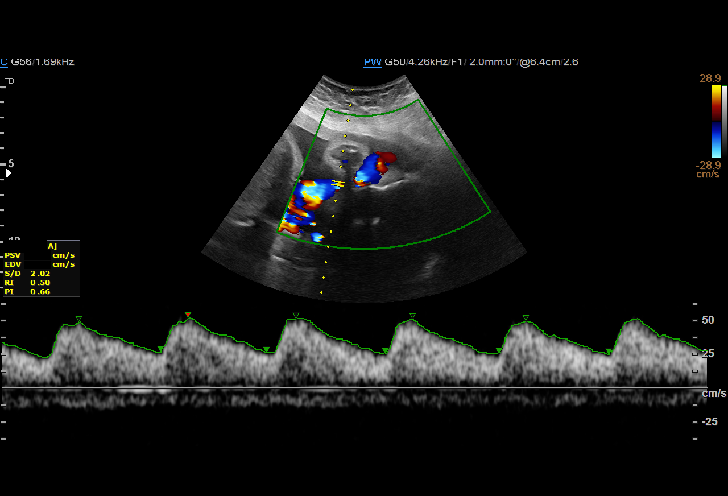
[im 15/20]
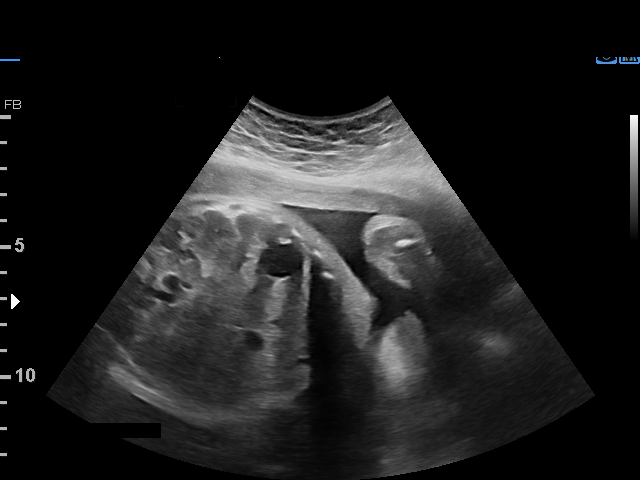
[im 16/20]
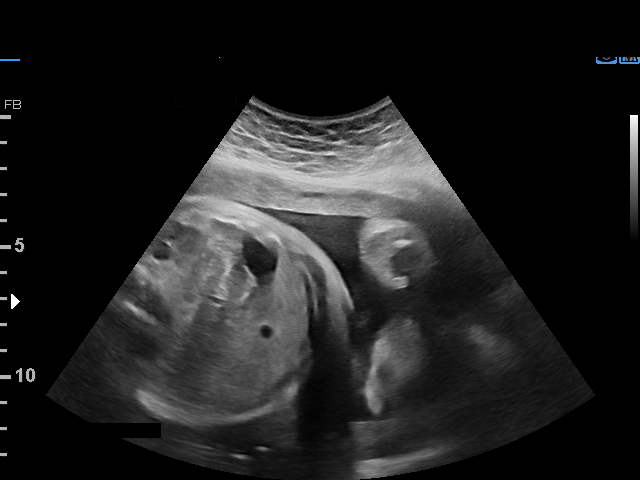
[im 17/20]
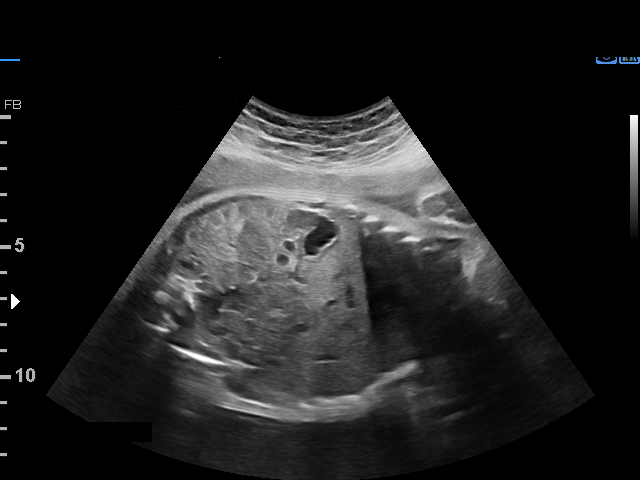
[im 19/20]
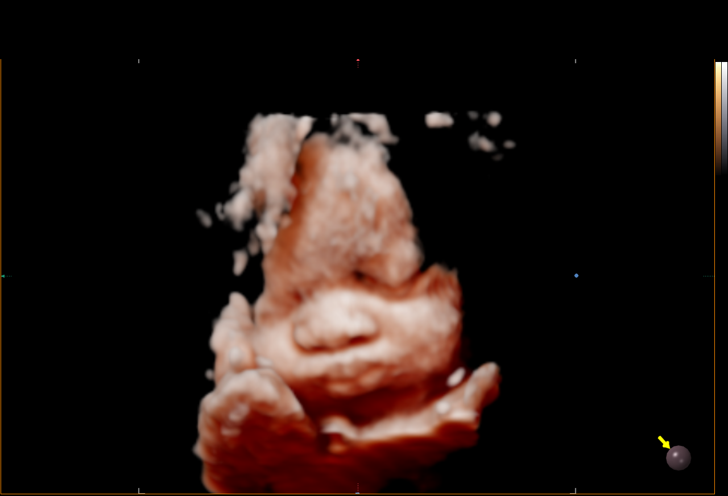
[im 20/20]
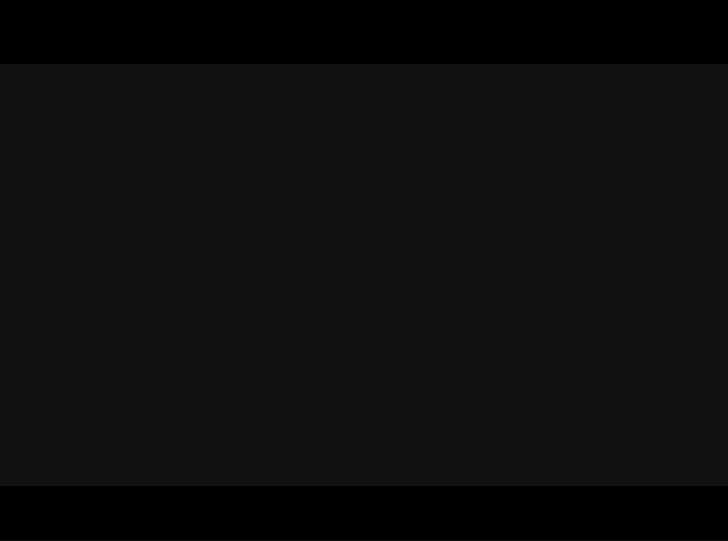

[16 of 20 positions shown; findings below may reference images not displayed]

Indications

 Maternal care for known or suspected poor
 fetal growth, third trimester, not applicable or
 unspecified IUGR
 Obesity complicating pregnancy, third
 trimester (pregravid BMI 40)
 Late to prenatal care, third trimester
 Poor obstetric history: Previous
 preeclampsia / eclampsia/gestational HTN
 35 weeks gestation of pregnancy
 Genetic carrier ([place]kza Laminie & Sickle Cell
 Disease)
 Low Risk NIPS

## 2022-11-17 IMAGING — US US MFM FETAL BPP W/ NON-STRESS
1 series · 12 of 28 positions shown · non-contrast
Comparison: none

[Series 1: us mfm fetal bpp w/ non-stress · 35 acquisitions, 12 frames shown]
[im 2/35]
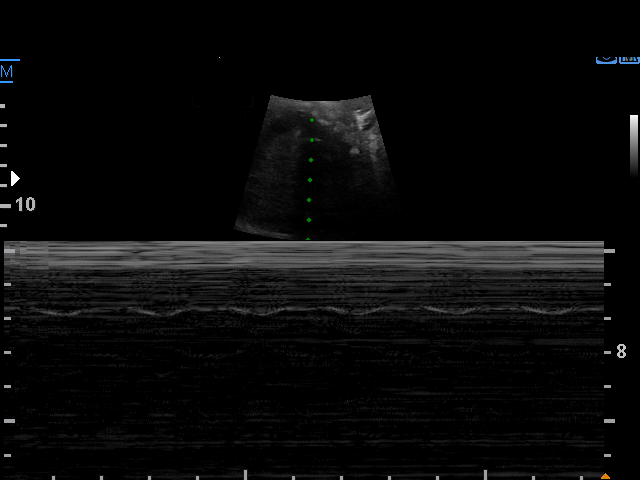
[im 4/35]
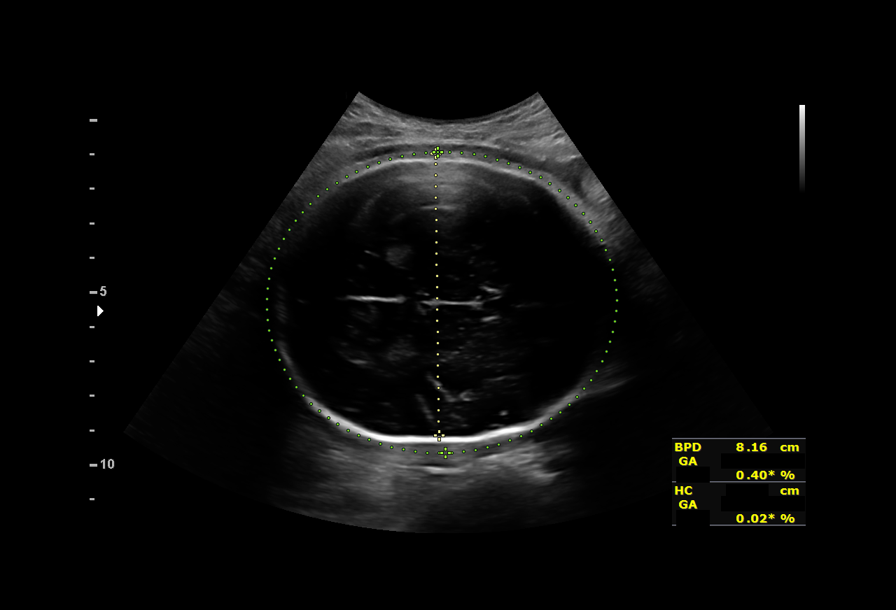
[im 7/35]
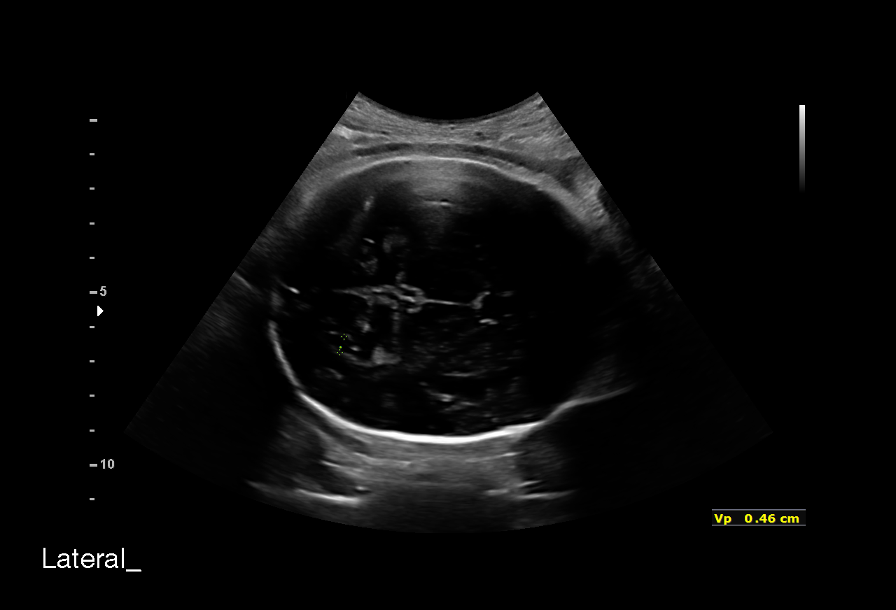
[im 11/35]
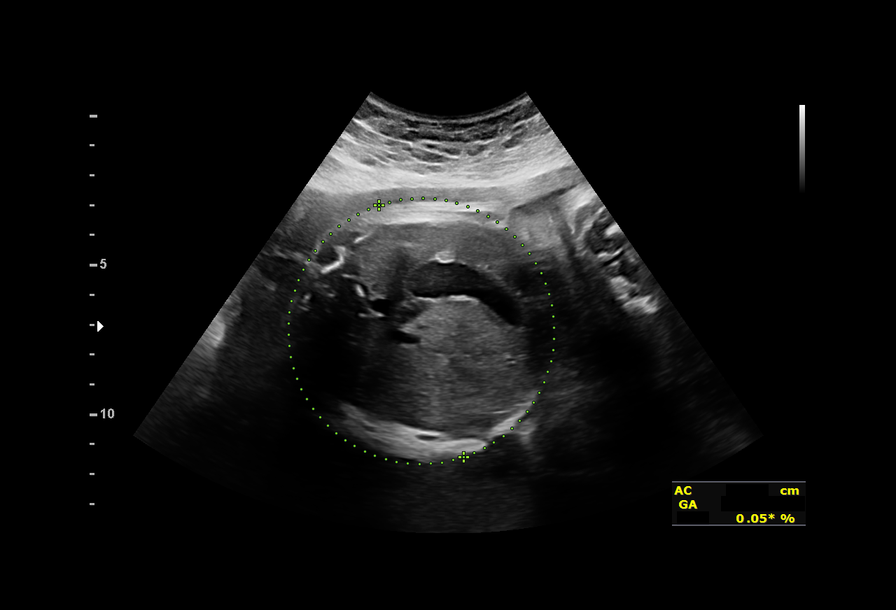
[im 13/35]
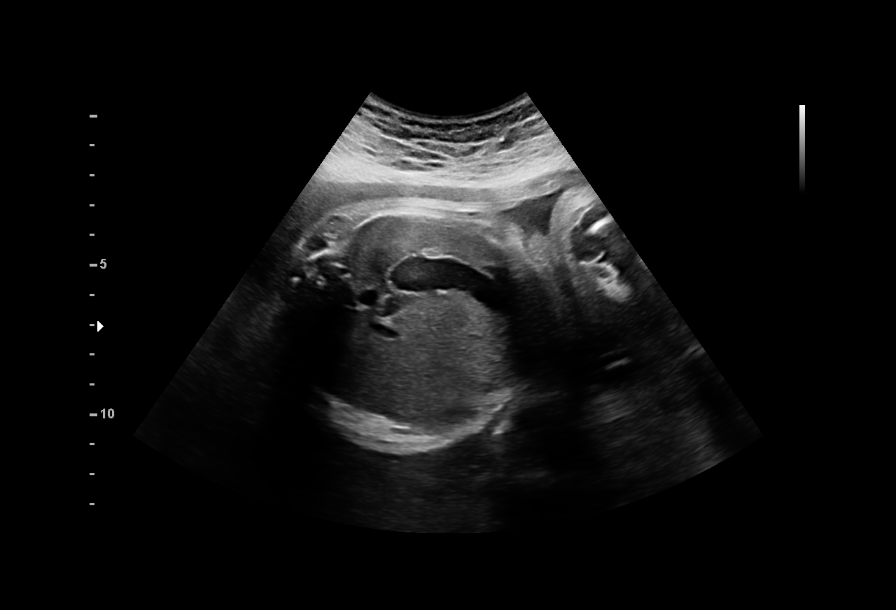
[im 16/35]
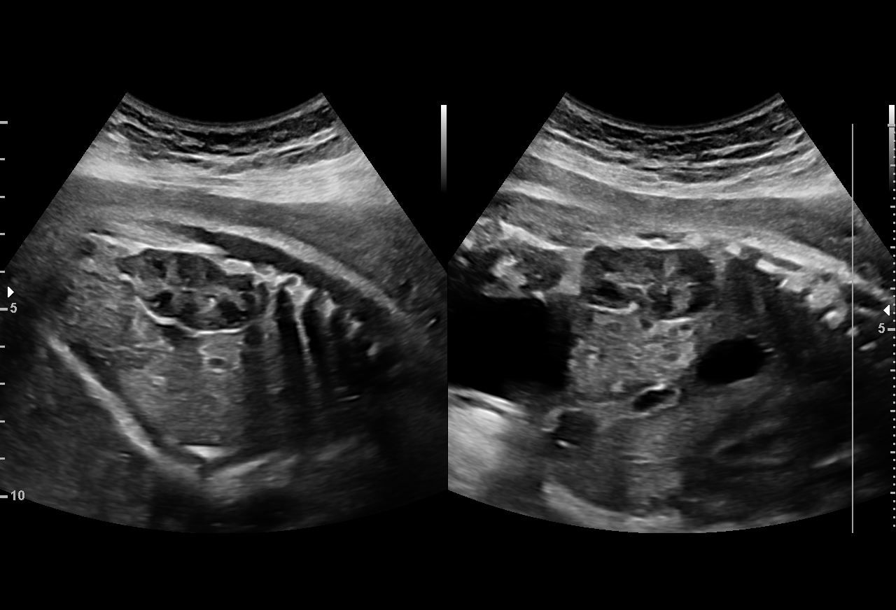
[im 19/35]
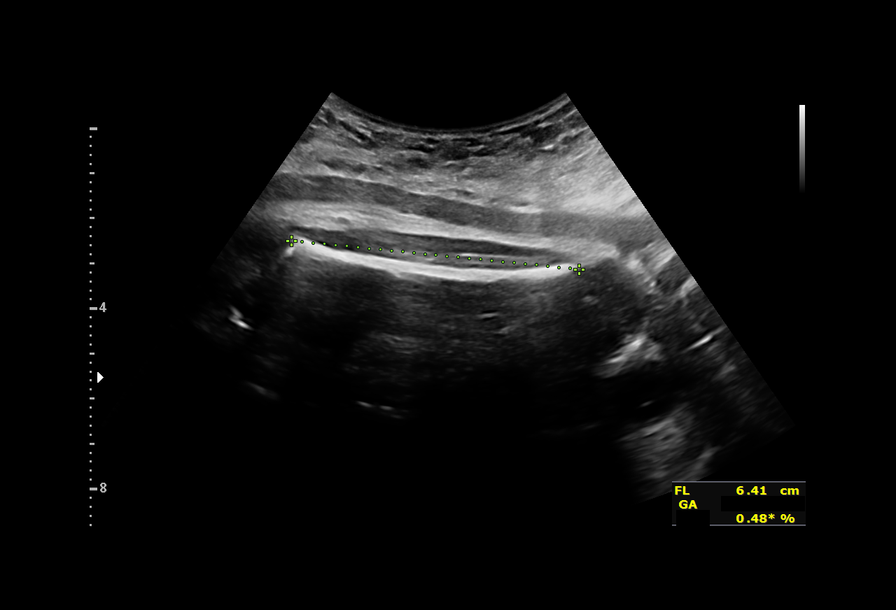
[im 22/35]
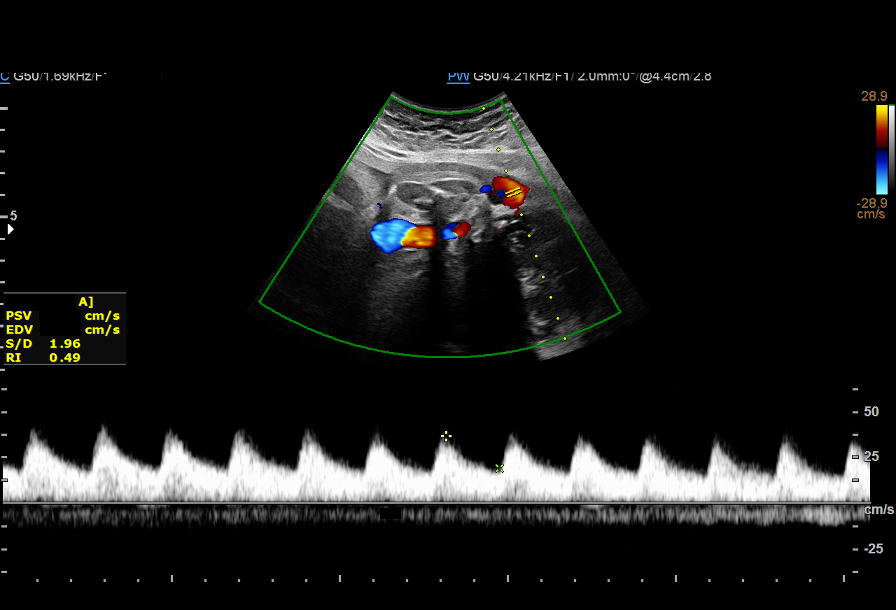
[im 24/35]
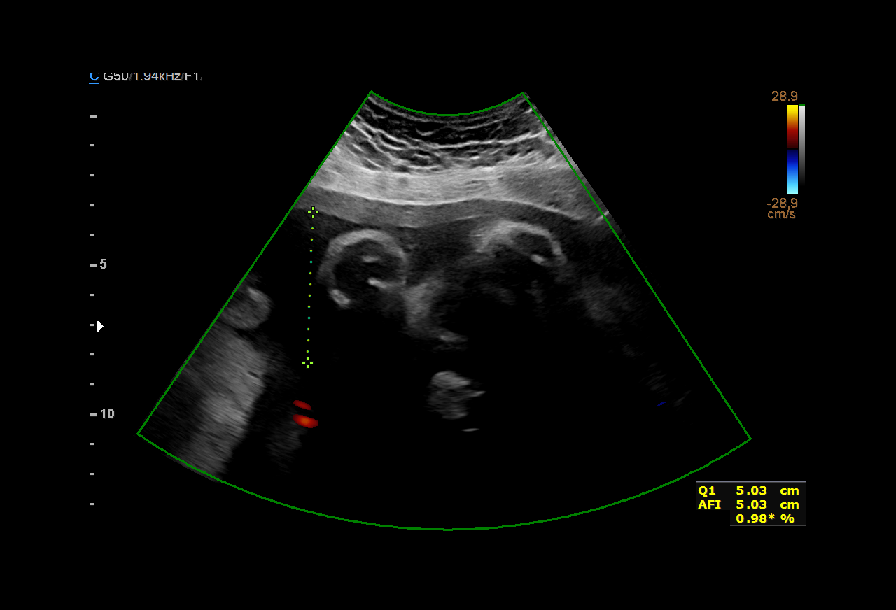
[im 28/35]
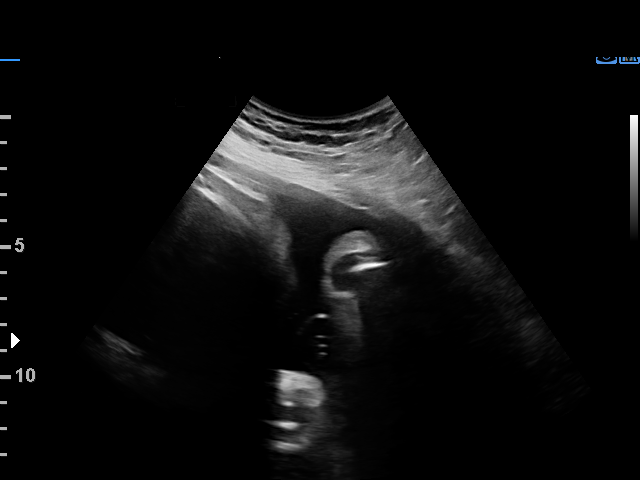
[im 31/35]
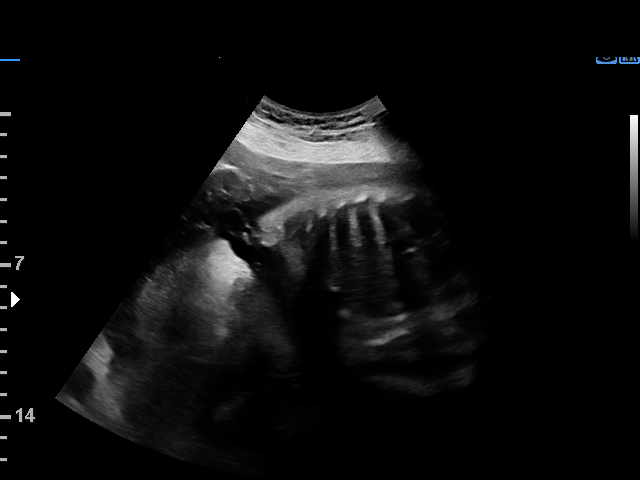
[im 33/35]
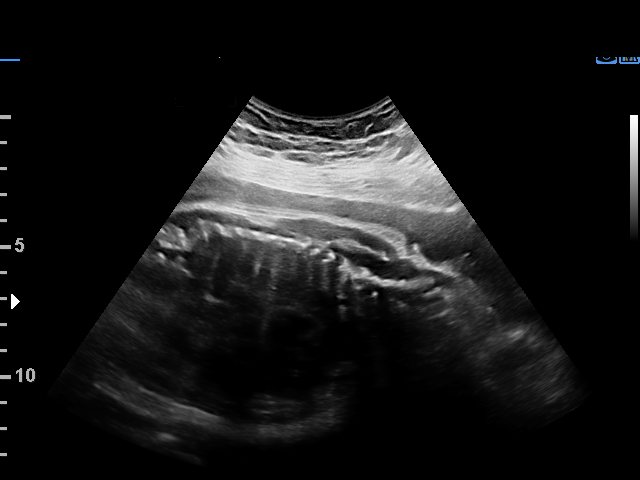

[12 of 28 positions shown; findings below may reference images not displayed]

W/NONSTRESS

Indications

 Maternal care for known or suspected poor
 fetal growth, third trimester, not applicable or
 unspecified IUGR
 Obesity complicating pregnancy, third
 trimester (pregravid BMI 40)
 Late to prenatal care, third trimester
 Poor obstetric history: Previous
 preeclampsia / eclampsia/gestational HTN
 Genetic carrier ([place]chardt & Sickle Cell
 Disease)
 36 weeks gestation of pregnancy
 Low Risk NIPS

## 2022-12-29 NOTE — Progress Notes (Deleted)
    SUBJECTIVE:   CHIEF COMPLAINT / HPI: check up  ***  PERTINENT  PMH / PSH: sickle cell trait, pulmonary stenosis, anemia  OBJECTIVE:   There were no vitals taken for this visit.  General: Awake and Alert in NAD HEENT: Normocephalic, atraumatic. Conjunctiva normal. No nasal discharge Cardiovascular: RRR. No M/R/G Respiratory: CTAB, normal WOB on RA. No wheezing, crackles, rhonchi, or diminished breath sounds. Abdomen: Soft, non-tender, non-distended. Bowel sounds normoactive/hypoactive/hyperactive. *** Extremities: No BLE edema, no deformities or significant joint findings. Skin: Warm and dry. Neuro: A&Ox***. No focal neurological deficits.   ASSESSMENT/PLAN:   No problem-specific Assessment & Plan notes found for this encounter.     Fortunato Curling, DO [facility]

## 2022-12-30 ENCOUNTER — Ambulatory Visit: Payer: Medicaid Other | Admitting: Family Medicine

## 2022-12-30 ENCOUNTER — Encounter: Payer: Self-pay | Admitting: Family Medicine

## 2023-01-13 ENCOUNTER — Ambulatory Visit: Payer: Medicaid Other | Admitting: Student

## 2023-01-13 ENCOUNTER — Encounter: Payer: Self-pay | Admitting: Student

## 2023-01-13 ENCOUNTER — Other Ambulatory Visit (HOSPITAL_COMMUNITY)
Admission: RE | Admit: 2023-01-13 | Discharge: 2023-01-13 | Disposition: A | Discharge status: Home / Self Care | Payer: Medicaid Other | Source: Ambulatory Visit | Attending: Family Medicine | Admitting: Family Medicine

## 2023-01-13 VITALS — BP 115/60 | HR 80 | Ht 63.0 in | Wt 219.6 lb

## 2023-01-13 DIAGNOSIS — N898 Other specified noninflammatory disorders of vagina: Secondary | ICD-10-CM | POA: Diagnosis present

## 2023-01-13 LAB — POCT WET PREP (WET MOUNT)
Clue Cells Wet Prep Whiff POC: NEGATIVE
Trichomonas Wet Prep HPF POC: ABSENT

## 2023-01-13 LAB — POCT URINE PREGNANCY: Preg Test, Ur: NEGATIVE

## 2023-01-13 MED ORDER — FLUCONAZOLE 150 MG PO TABS
150.0000 mg | ORAL_TABLET | Freq: Once | ORAL | 0 refills | Status: AC
Start: 2023-01-13 — End: 2023-01-13

## 2023-01-13 NOTE — Assessment & Plan Note (Signed)
Wet prep performed today shows yeast infection. Patient is interested in STI screening. Discussed intermittent vaginal bleeding likely d/t patch being put on at a different time.  Called patient and confirmed date of birth.  Discussed wet prep results and will send in Diflucan for patient. UPT negative. Plan: -Wet prep as above.  Will treat with diflucan. -HIV, RPR, G/C -Discussed protection during intercourse and contraceptive methods -Follow-up as needed

## 2023-01-13 NOTE — Progress Notes (Signed)
    SUBJECTIVE:   CHIEF COMPLAINT / HPI:   Vaginal Discharge: The patient presents with a week-long history of vaginal itching and an irregular menstrual cycle. She reports having two menstrual cycles in the same month, which is unusual for her as she typically has one cycle at the end of each month. She has been using the Xulane patch for birth control since 2023 and reports a recent incident where she forgot to apply the patch on time. She denies any new sexual partners and is up to date on her Pap smear. She has not been on any antibiotics recently. Denies any shortness of breath. Lightheadedness or large bleeding.  PERTINENT  PMH / PSH: Hx of gonnorhea, chalmydia  OBJECTIVE:   BP 115/60   Pulse 80   Ht 5\' 3"  (1.6 m)   Wt 219 lb 9.6 oz (99.6 kg)   LMP 12/13/2022   SpO2 98%   BMI 38.90 kg/m    General: NAD, pleasant, able to participate in exam Respiratory: Normal effort, no obvious respiratory distress Pelvic: VULVA: normal appearing vulva with no masses, tenderness or lesions, VAGINA: Normal appearing vagina with normal color, no lesions, with clear discharge present, CERVIX: No lesions, clear discharge present  Chaperone Gillermina Phy CMA present for pelvic exam  ASSESSMENT/PLAN:   Assessment & Plan Vaginal discharge Wet prep performed today shows yeast infection. Patient is interested in STI screening. Discussed intermittent vaginal bleeding likely d/t patch being put on at a different time.  Called patient and confirmed date of birth.  Discussed wet prep results and will send in Diflucan for patient. UPT negative. Plan: -Wet prep as above.  Will treat with diflucan. -HIV, RPR, G/C -Discussed protection during intercourse and contraceptive methods -Follow-up as needed    Levin Erp, MD [facility]

## 2023-01-13 NOTE — Patient Instructions (Addendum)
It was great to see you! Thank you for allowing me to participate in your care!   Our plans for today:  - I will let you know what your swabs show - You will need to go to [facility] to get HIV and syphilis labs drawn  [address]  [phone]  Take care and seek immediate care sooner if you develop any concerns.  Levin Erp, MD

## 2023-01-14 LAB — CERVICOVAGINAL ANCILLARY ONLY
Chlamydia: POSITIVE — AB
Comment: NEGATIVE
Comment: NEGATIVE
Comment: NORMAL
Neisseria Gonorrhea: POSITIVE — AB
Trichomonas: NEGATIVE

## 2023-01-15 ENCOUNTER — Telehealth: Payer: Self-pay | Admitting: Student

## 2023-01-15 DIAGNOSIS — A749 Chlamydial infection, unspecified: Secondary | ICD-10-CM

## 2023-01-15 MED ORDER — DOXYCYCLINE HYCLATE 100 MG PO TABS
100.0000 mg | ORAL_TABLET | Freq: Two times a day (BID) | ORAL | 0 refills | Status: AC
Start: 2023-01-15 — End: 2023-01-22

## 2023-01-15 NOTE — Telephone Encounter (Signed)
Called patient, confirmed name and DOB.  Discussed findings on the swab showed gonorrhea and chlamydia.  Discussed I will send in doxycycline for her to start taking and I have scheduled her nurse visit on 12/17 for the ceftriaxone IM injection.  Due to her weight she would get the 500 mg IM dose.  I discussed that her partners need to be treated and offered partner treatment today which she declined.  I also recommended abstaining from sexual intercourse while being treated and using protection.

## 2023-01-16 ENCOUNTER — Telehealth: Payer: Self-pay

## 2023-01-16 ENCOUNTER — Ambulatory Visit: Payer: Medicaid Other | Admitting: Family Medicine

## 2023-01-16 NOTE — Telephone Encounter (Signed)
-----   Message from [facility] Molly Maduro B sent at 01/15/2023  5:23 PM EST ----- Regarding: STI reporting Positive gonorrhea and chlamydia

## 2023-01-16 NOTE — Telephone Encounter (Signed)
 STI form completed and faxed to [facility] Department. Aquilla Solian, CMA

## 2023-01-20 ENCOUNTER — Ambulatory Visit: Payer: Medicaid Other

## 2023-01-20 ENCOUNTER — Other Ambulatory Visit: Payer: Self-pay

## 2023-01-20 DIAGNOSIS — A549 Gonococcal infection, unspecified: Secondary | ICD-10-CM

## 2023-01-20 DIAGNOSIS — Z30016 Encounter for initial prescription of transdermal patch hormonal contraceptive device: Secondary | ICD-10-CM

## 2023-01-20 MED ORDER — NORELGESTROMIN-ETH ESTRADIOL 150-35 MCG/24HR TD PTWK: 1.0000 | MEDICATED_PATCH | TRANSDERMAL | 7 refills | Status: DC

## 2023-01-20 MED ORDER — CEFTRIAXONE SODIUM 250 MG IJ SOLR
250.0000 mg | Freq: Once | INTRAMUSCULAR | Status: AC
Start: 2023-01-20 — End: 2023-01-20
  Administered 2023-01-20: 250 mg via INTRAMUSCULAR

## 2023-01-20 NOTE — Progress Notes (Signed)
Patient presents to nurse clinic for treatment of gonorrhea.   Rocephin 250 mg administered in LUOQ and Rocephin 250 mg administered in RUOQ per orders from Dr. Laroy Apple.   Patient observed for 15 minutes post injections. Patient tolerated injections well, no adverse reaction noted.   Spoke with Dr. Deirdre Priest regarding time frame for when patient should have retesting performed. Advised that patient should follow up in 4-6 weeks. Patient reports that she will call back to schedule follow up visit.

## 2023-02-05 ENCOUNTER — Encounter: Payer: Self-pay | Admitting: Student

## 2023-02-05 ENCOUNTER — Ambulatory Visit (INDEPENDENT_AMBULATORY_CARE_PROVIDER_SITE_OTHER): Payer: Medicaid Other | Admitting: Student

## 2023-02-05 VITALS — BP 121/61 | HR 82 | Wt 211.6 lb

## 2023-02-05 DIAGNOSIS — A64 Unspecified sexually transmitted disease: Secondary | ICD-10-CM | POA: Diagnosis present

## 2023-02-05 HISTORY — DX: Unspecified sexually transmitted disease: A64

## 2023-02-05 NOTE — Progress Notes (Signed)
    SUBJECTIVE:   CHIEF COMPLAINT / HPI:   Anita Hayes is here to discuss need for test of cure.  She test positive for chlamydia and gonorrhea on 12/1 10.  She received treatment on 12/17, was informed to return for test of cure in 4 to 6 weeks.  She reports her partner received treatment, has been sexually active one time then. She is using the Xulane transdermal patch for birth control.  Denies any concerns or complaints today.  OBJECTIVE:   BP 121/61   Pulse 82   Wt 211 lb 9.6 oz (96 kg)   LMP 01/22/2023 (Exact Date)   SpO2 100%   Breastfeeding No   BMI 37.48 kg/m   Normal: Pleasant well-appearing, no distress Respiratory: Normal effort on room air   ASSESSMENT/PLAN:   Sexually transmitted infection Unfortunately, it is too soon to obtain test of cure as it has only been 16 days since her treatment. She will schedule a follow-up visit in the next 2 to 3 weeks for test of cure.     Blase Beckner, DO [facility]

## 2023-02-05 NOTE — Assessment & Plan Note (Signed)
 Unfortunately, it is too soon to obtain test of cure as it has only been 16 days since her treatment. She will schedule a follow-up visit in the next 2 to 3 weeks for test of cure.

## 2023-02-05 NOTE — Progress Notes (Deleted)
    SUBJECTIVE:   CHIEF COMPLAINT / HPI:  STI check  Patient is a 23 y.o. female presenting for STI check.  PERTINENT  PMH / PSH: ***None relevant  OBJECTIVE:   BP 121/61   Pulse 82   Wt 211 lb 9.6 oz (96 kg)   LMP 01/22/2023 (Exact Date)   SpO2 100%   Breastfeeding No   BMI 37.48 kg/m    General: NAD, pleasant, able to participate in exam Respiratory: Normal effort, no obvious respiratory distress Pelvic: VULVA: normal appearing vulva with no masses, tenderness or lesions, VAGINA: Normal appearing vagina with normal color, no lesions, with {GYN VAGINAL DISCHARGE:21986} discharge present, ***CERVIX: No lesions, {GYN VAGINAL DISCHARGE:21986} discharge present,  Chaperone *** present for pelvic exam  ASSESSMENT/PLAN:   No problem-specific Assessment & Plan notes found for this encounter.    Assessment:  23 y.o. female with vaginal discharge for***days, as well as***.  Physical exam significant for*** discharge.  Wet prep performed today shows *** consistent with ***.  Patient is interested in STI screening.   Plan: -Wet prep as above.  Will treat with***. -GC/chlamydia pending -Will check HIV and RPR  Barabara Dama, DO [facility]  {    This will disappear when note is signed, click to select method of visit    :1}

## 2023-02-05 NOTE — Patient Instructions (Signed)
 It was great seeing you today.  It is too soon to retest you. Please schedule a follow-up visit with us  in the next few weeks and we will perform retesting at that time.  If you have any questions or concerns, please feel free to call the clinic.   Have a wonderful day,  Dr. Barabara Dama [facility] [phone]

## 2023-03-06 ENCOUNTER — Other Ambulatory Visit (HOSPITAL_COMMUNITY)
Admission: RE | Admit: 2023-03-06 | Discharge: 2023-03-06 | Disposition: A | Discharge status: Home / Self Care | Payer: Medicaid Other | Source: Ambulatory Visit | Attending: Family Medicine | Admitting: Family Medicine

## 2023-03-06 ENCOUNTER — Ambulatory Visit (INDEPENDENT_AMBULATORY_CARE_PROVIDER_SITE_OTHER): Payer: Medicaid Other | Admitting: Family Medicine

## 2023-03-06 VITALS — BP 115/69 | HR 71 | Ht 63.0 in | Wt 210.8 lb

## 2023-03-06 DIAGNOSIS — Z114 Encounter for screening for human immunodeficiency virus [HIV]: Secondary | ICD-10-CM | POA: Diagnosis not present

## 2023-03-06 DIAGNOSIS — Z113 Encounter for screening for infections with a predominantly sexual mode of transmission: Secondary | ICD-10-CM | POA: Insufficient documentation

## 2023-03-06 DIAGNOSIS — R739 Hyperglycemia, unspecified: Secondary | ICD-10-CM

## 2023-03-06 DIAGNOSIS — B3731 Acute candidiasis of vulva and vagina: Secondary | ICD-10-CM

## 2023-03-06 LAB — POCT WET PREP (WET MOUNT)
Clue Cells Wet Prep Whiff POC: NEGATIVE
Trichomonas Wet Prep HPF POC: ABSENT
WBC, Wet Prep HPF POC: >20

## 2023-03-06 LAB — POCT GLYCOSYLATED HEMOGLOBIN (HGB A1C): Hemoglobin A1C: 5.5 % (ref 4.0–5.6)

## 2023-03-06 MED ORDER — FLUCONAZOLE 150 MG PO TABS
150.0000 mg | ORAL_TABLET | Freq: Once | ORAL | 0 refills | Status: AC
Start: 2023-03-06 — End: 2023-03-06

## 2023-03-06 NOTE — Assessment & Plan Note (Addendum)
Asymptomatic.  Recent history of gonorrhea treated 1 month ago.  Gonorrhea, chlamydia, trichomonas, BV, yeast, HIV, RPR testing done today.  Will follow-up results  Wet mount showed yeast - sent diflucan. Called patient with these results and provided instructions for diflucan. F/u remainder of results.

## 2023-03-06 NOTE — Progress Notes (Cosign Needed)
    SUBJECTIVE:   CHIEF COMPLAINT / HPI:   Desires STD screening Recently treated for gonorrhea in December 2024 Symptoms: Small amount of vaginal discharge.  No dysuria, hematuria, abdominal pain, itching Contraception: Xulane Patch  Desires testing for diabetes Hyperglycemia noted on previous BMP Reports recent hx of polyuria and craving sweets Not concerned about pregnancy, does not want UPT today Does not feel like she has a UTI   PERTINENT  PMH / PSH: Recent history of gonorrhea, obesity  OBJECTIVE:   BP 115/69   Pulse 71   Ht 5\' 3"  (1.6 m)   Wt 210 lb 12.8 oz (95.6 kg)   LMP 02/11/2023 (Exact Date)   SpO2 100%   BMI 37.34 kg/m   General: NAD, pleasant, able to participate in exam Respiratory: No respiratory distress Skin: warm and dry, no rashes noted Psych: Normal affect and mood  Pelvic exam: VULVA: normal appearing vulva with no masses, tenderness or lesions, VAGINA: vaginal discharge - creamy and scant, CERVIX: normal appearing cervix without discharge or lesions, exam chaperoned by [facility] CMA.   ASSESSMENT/PLAN:   Assessment & Plan Routine screening for STI (sexually transmitted infection) Asymptomatic.  Recent history of gonorrhea treated 1 month ago.  Gonorrhea, chlamydia, trichomonas, BV, yeast, HIV, RPR testing done today.  Will follow-up results  Wet mount showed yeast - sent diflucan. Called patient with these results and provided instructions for diflucan. F/u remainder of results. Hyperglycemia Noted on previous BMP Patient with some polyuria and cravings, desires testing for diabetes Unknown family hx She does have obesity A1c 5.5 - no diabetes or prediabetes - discussed with patient  Due for flu and HPV shot - pt prefers to call back and schedule appt for this  Vonna Drafts, MD [facility]

## 2023-03-06 NOTE — Patient Instructions (Signed)
If any results from today are abnormal I will let you know

## 2023-03-07 LAB — RPR: RPR Ser Ql: NONREACTIVE

## 2023-03-07 LAB — HIV ANTIBODY (ROUTINE TESTING W REFLEX): HIV Screen 4th Generation wRfx: NONREACTIVE

## 2023-03-09 ENCOUNTER — Encounter: Payer: Self-pay | Admitting: Family Medicine

## 2023-03-09 LAB — CERVICOVAGINAL ANCILLARY ONLY
Bacterial Vaginitis (gardnerella): NEGATIVE
Candida Glabrata: NEGATIVE
Candida Vaginitis: POSITIVE — AB
Chlamydia: NEGATIVE
Comment: NEGATIVE
Comment: NEGATIVE
Comment: NEGATIVE
Comment: NEGATIVE
Comment: NEGATIVE
Comment: NORMAL
Neisseria Gonorrhea: NEGATIVE
Trichomonas: NEGATIVE

## 2023-04-07 ENCOUNTER — Ambulatory Visit (INDEPENDENT_AMBULATORY_CARE_PROVIDER_SITE_OTHER): Payer: Medicaid Other | Admitting: Student

## 2023-04-07 ENCOUNTER — Encounter: Payer: Self-pay | Admitting: Student

## 2023-04-07 ENCOUNTER — Other Ambulatory Visit (HOSPITAL_COMMUNITY)
Admission: RE | Admit: 2023-04-07 | Discharge: 2023-04-07 | Disposition: A | Discharge status: Home / Self Care | Source: Ambulatory Visit | Attending: Family Medicine | Admitting: Family Medicine

## 2023-04-07 ENCOUNTER — Ambulatory Visit: Payer: Medicaid Other | Admitting: Student

## 2023-04-07 VITALS — BP 122/62 | HR 68 | Ht 63.0 in | Wt 214.2 lb

## 2023-04-07 DIAGNOSIS — Z113 Encounter for screening for infections with a predominantly sexual mode of transmission: Secondary | ICD-10-CM | POA: Diagnosis not present

## 2023-04-07 DIAGNOSIS — L731 Pseudofolliculitis barbae: Secondary | ICD-10-CM

## 2023-04-07 LAB — POCT WET PREP (WET MOUNT)
Clue Cells Wet Prep Whiff POC: NEGATIVE
Trichomonas Wet Prep HPF POC: ABSENT

## 2023-04-07 NOTE — Assessment & Plan Note (Signed)
 Asymptomatic.  Not using barrier methods. - Counseled on barrier methods - GC chlamydia, trichomonas, wet prep

## 2023-04-07 NOTE — Patient Instructions (Signed)
 It was great to see you! Thank you for allowing me to participate in your care!   I recommend that you always bring your medications to each appointment as this makes it easy to ensure we are on the correct medications and helps [place] not miss when refills are needed.  Our plans for today:  - We are checking some labs today, I will call you if they are abnormal will send you a MyChart message or a letter if they are normal.  If you do not hear about your labs in the next 2 weeks please let us know. -I recommend he schedule appointment with PCP to discuss contraceptive management, as there is a chance that your patch is not effective with your BMI  Take care and seek immediate care sooner if you develop any concerns. Please remember to show up 15 minutes before your scheduled appointment time!  Tiffany Kocher, DO [facility]

## 2023-04-07 NOTE — Progress Notes (Signed)
    SUBJECTIVE:   CHIEF COMPLAINT / HPI:   Bumps on pelvis Patient is a 23 year old female presenting with bumps on her pelvic skin, this has been ongoing for several months.  Patient shaves pubic hair, and uses Nair cream.  She notices bumps frequently, potentially worse after shaving.  Bumps form, Nikolov domes and then burst.  Not painful after bursting.  She uses patch for birth control, we discussed that given her BMI this is not an good option for her-recommended she follow-up with PCP discussed contraceptive options.  She does not use barrier method consistently.  OBJECTIVE:   BP 122/62   Pulse 68   Ht 5\' 3"  (1.6 m)   Wt 214 lb 4 oz (97.2 kg)   LMP 03/19/2023   SpO2 98%   BMI 37.95 kg/m    General: NAD, pleasant, able to participate in exam Respiratory: Normal effort, no obvious respiratory distress Pelvic: VULVA: normal appearing vulva with no masses, tenderness or lesions, VAGINA: Normal appearing vagina with normal color, no lesions, with scant and [place] discharge present. CERVIX: No lesions, scant, [place], and thin discharge present  Chaperone Tashira CMA present for pelvic exam  ASSESSMENT/PLAN:   Assessment & Plan Ingrown hair No lesions present today, given history highly suspicious for ingrown hairs not herpes simplex virus. - Recommend discontinuing shaving if bumps are painful, and to see if they resolve - STI screening as below Routine screening for STI (sexually transmitted infection) Asymptomatic.  Not using barrier methods. - Counseled on barrier methods - GC chlamydia, trichomonas, wet prep  Tiffany Kocher, DO [facility] Center

## 2023-04-08 ENCOUNTER — Encounter: Payer: Self-pay | Admitting: Student

## 2023-04-08 LAB — RPR: RPR Ser Ql: NONREACTIVE

## 2023-04-08 LAB — HIV ANTIBODY (ROUTINE TESTING W REFLEX): HIV Screen 4th Generation wRfx: NONREACTIVE

## 2023-04-09 LAB — CERVICOVAGINAL ANCILLARY ONLY
Chlamydia: NEGATIVE
Comment: NEGATIVE
Comment: NEGATIVE
Comment: NORMAL
Neisseria Gonorrhea: NEGATIVE
Trichomonas: NEGATIVE

## 2023-06-09 ENCOUNTER — Ambulatory Visit: Admitting: Student

## 2023-06-23 ENCOUNTER — Encounter: Payer: Self-pay | Admitting: Student

## 2023-06-23 ENCOUNTER — Ambulatory Visit (INDEPENDENT_AMBULATORY_CARE_PROVIDER_SITE_OTHER): Admitting: Student

## 2023-06-23 ENCOUNTER — Other Ambulatory Visit (HOSPITAL_COMMUNITY)
Admission: RE | Admit: 2023-06-23 | Discharge: 2023-06-23 | Disposition: A | Discharge status: Home / Self Care | Source: Ambulatory Visit | Attending: Family Medicine | Admitting: Family Medicine

## 2023-06-23 VITALS — BP 109/70 | HR 82 | Ht 63.0 in | Wt 218.6 lb

## 2023-06-23 DIAGNOSIS — H60391 Other infective otitis externa, right ear: Secondary | ICD-10-CM

## 2023-06-23 DIAGNOSIS — Z113 Encounter for screening for infections with a predominantly sexual mode of transmission: Secondary | ICD-10-CM | POA: Insufficient documentation

## 2023-06-23 LAB — POCT URINE PREGNANCY: Preg Test, Ur: NEGATIVE

## 2023-06-23 MED ORDER — NEOMYCIN-POLYMYXIN-HC 3.5-10000-1 OT SUSP
4.0000 [drp] | Freq: Four times a day (QID) | OTIC | 0 refills | Status: AC
Start: 2023-06-23 — End: 2023-06-30

## 2023-06-23 NOTE — Patient Instructions (Addendum)
 Lexius,  You are not pregnant.  Great to meet you! I am sending in some ear drops to treat your ear. It looks like you've got a little bit of a superficial infection in that ear canal.  I am sending off your STD testing. I encourage you to think about PrEP to prevent HIV. Alexa Andrews, MD

## 2023-06-23 NOTE — Assessment & Plan Note (Signed)
 U preg negative here. She assures me she is safe in her established relationship with one female partner. Asymptomatic. - Aptima swab for GC/Chlamydia/Trichomonas  - HIV - Hep C - RPR - Would be a candidate for PrEP, encourage her to give this some thought  - Using the patch for contraception

## 2023-06-23 NOTE — Progress Notes (Signed)
    SUBJECTIVE:   CHIEF COMPLAINT / HPI:   R Ear Pain R ear pain and "sores" present for several days. Never had this issue in the past and not tried anything for it. Also tells me she has had some "drainage"   STD Testing Would like pregnancy and STD testing. Sexually active with one female partner. Tells me she has no concerns, symptoms, or known exposures to STIs but gets regular testing as a precaution. She assures me she feels safe in her relationship. She was positive for both gonorrhea and chlamydia in December of last year. She is not interested in PrEP therapy for HIV precention.    OBJECTIVE:   BP 109/70   Pulse 82   Ht 5\' 3"  (1.6 m)   Wt 218 lb 9.6 oz (99.2 kg)   LMP 06/08/2023   SpO2 99%   BMI 38.72 kg/m   Gen: Well appearing, a bit withdrawn but NAD HENT: R ear canal with fissuring and serous drainage in canal, TM is unremarkable  GU: External genitalia appear normal, no internal/speculum exam performed   ASSESSMENT/PLAN:   Assessment & Plan Other infective acute otitis externa of right ear Exam and history c/w acute otitis externa - Cortisporin gtt qid x7 days  Routine screening for STI (sexually transmitted infection) U preg negative here. She assures me she is safe in her established relationship with one female partner. Asymptomatic. - Aptima swab for GC/Chlamydia/Trichomonas  - HIV - Hep C - RPR - Would be a candidate for PrEP, encourage her to give this some thought  - Using the patch for contraception       J Lark Plum, MD [facility] Center

## 2023-06-24 LAB — CERVICOVAGINAL ANCILLARY ONLY
Chlamydia: NEGATIVE
Comment: NEGATIVE
Comment: NEGATIVE
Comment: NORMAL
Neisseria Gonorrhea: NEGATIVE
Trichomonas: NEGATIVE

## 2023-06-24 LAB — HCV INTERPRETATION

## 2023-06-24 LAB — HCV AB W REFLEX TO QUANT PCR: HCV Ab: NONREACTIVE

## 2023-06-24 LAB — HIV ANTIBODY (ROUTINE TESTING W REFLEX): HIV Screen 4th Generation wRfx: NONREACTIVE

## 2023-06-24 LAB — RPR: RPR Ser Ql: NONREACTIVE

## 2023-06-25 ENCOUNTER — Ambulatory Visit: Payer: Self-pay | Admitting: Student

## 2023-08-10 ENCOUNTER — Other Ambulatory Visit: Payer: Self-pay | Admitting: Family Medicine

## 2023-08-10 DIAGNOSIS — Z30016 Encounter for initial prescription of transdermal patch hormonal contraceptive device: Secondary | ICD-10-CM

## 2023-08-10 NOTE — Telephone Encounter (Signed)
 Chart reviewed. Rx refilled.

## 2023-08-17 ENCOUNTER — Ambulatory Visit

## 2023-10-27 ENCOUNTER — Ambulatory Visit: Admitting: Family Medicine

## 2023-10-27 NOTE — Progress Notes (Deleted)
    SUBJECTIVE:   CHIEF COMPLAINT / HPI:   ***  PERTINENT  PMH / PSH: Sickle cell trait, chlamydia  OBJECTIVE:   There were no vitals taken for this visit.  General: Awake and Alert in NAD HEENT: NCAT. Sclera anicteric. No rhinorrhea. Cardiovascular: RRR. No M/R/G Respiratory: CTAB, normal WOB on RA. No wheezing, crackles, rhonchi, or diminished breath sounds. Abdomen: Soft, non-tender, non-distended. Bowel sounds normoactive/hypoactive/hyperactive. *** Extremities: Able to move all extremities. No BLE edema, no deformities or significant joint findings. Skin: Warm and dry. No abrasions or rashes noted. Neuro: A&Ox***. No focal neurological deficits.  ASSESSMENT/PLAN:   Assessment & Plan      Kathrine Melena, DO [facility]

## 2023-11-22 ENCOUNTER — Other Ambulatory Visit: Payer: Self-pay

## 2023-11-22 ENCOUNTER — Emergency Department (HOSPITAL_COMMUNITY)
Admission: EM | Admit: 2023-11-22 | Discharge: 2023-11-22 | Disposition: A | Discharge status: Home / Self Care | Attending: Emergency Medicine | Admitting: Emergency Medicine

## 2023-11-22 DIAGNOSIS — Z3202 Encounter for pregnancy test, result negative: Secondary | ICD-10-CM | POA: Diagnosis not present

## 2023-11-22 DIAGNOSIS — R109 Unspecified abdominal pain: Secondary | ICD-10-CM | POA: Insufficient documentation

## 2023-11-22 DIAGNOSIS — R197 Diarrhea, unspecified: Secondary | ICD-10-CM | POA: Insufficient documentation

## 2023-11-22 DIAGNOSIS — Z3200 Encounter for pregnancy test, result unknown: Secondary | ICD-10-CM | POA: Diagnosis present

## 2023-11-22 DIAGNOSIS — M545 Low back pain, unspecified: Secondary | ICD-10-CM | POA: Insufficient documentation

## 2023-11-22 LAB — URINALYSIS, ROUTINE W REFLEX MICROSCOPIC
Bacteria, UA: NONE SEEN
Bilirubin Urine: NEGATIVE
Glucose, UA: NEGATIVE mg/dL
Hgb urine dipstick: NEGATIVE
Ketones, ur: NEGATIVE mg/dL
Nitrite: NEGATIVE
Protein, ur: NEGATIVE mg/dL
Specific Gravity, Urine: 1.018 (ref 1.005–1.030)
pH: 6 (ref 5.0–8.0)

## 2023-11-22 LAB — PREGNANCY, URINE: Preg Test, Ur: NEGATIVE

## 2023-11-22 LAB — HCG, SERUM, QUALITATIVE: Preg, Serum: NEGATIVE

## 2023-11-22 NOTE — Discharge Instructions (Signed)
 Please continue to monitor your cycle, if you go another several days without menstruation I recommend rechecking urine pregnancy test.  There is no evidence of pregnancy at this time.

## 2023-11-22 NOTE — ED Provider Notes (Signed)
 [place] EMERGENCY DEPARTMENT AT [facility] Provider Note   CSN: [id number] Arrival date & time: 11/22/23  9078     Patient presents with: Back Pain and Possible Pregnancy   Anita Hayes is a 23 y.o. female with overall noncontributory past medical history who presents concern for some mild low back pain and abdominal discomfort, she reports she has 7 days late her cycle.  She has had 2 negative home pregnancy test, but she wanted to confirm with a blood test that she may be pregnant.  She denies any fever, chills, dysuria.  She reports she is only hyperventilate in the past when she is pregnant.    Back Pain Possible Pregnancy       Prior to Admission medications   Medication Sig Start Date End Date Taking? Authorizing Provider  ibuprofen  (ADVIL ) 200 MG tablet Take 200 mg by mouth every 6 (six) hours as needed for mild pain.    [provider]  norelgestromin -ethinyl estradiol  (XULANE) 150-35 MCG/24HR transdermal patch APPLY 1 PATCH ONCE A WEEK 08/10/23   Diona Perkins, MD  sertraline  (ZOLOFT ) 50 MG tablet TAKE 1 TABLET BY MOUTH EVERY DAY Patient not taking: Reported on 07/30/2022 10/16/21   Lilland, Alana, DO    Allergies: Patient has no known allergies.    Review of Systems  Musculoskeletal:  Positive for back pain.  All other systems reviewed and are negative.   Updated Vital Signs BP (!) 137/96   Pulse 88   Temp 98.4 F (36.9 C) (Oral)   Resp 19   Ht 5' 3 (1.6 m)   Wt 100 kg   LMP 10/28/2023   SpO2 100%   BMI 39.05 kg/m   Physical Exam Vitals and nursing note reviewed.  Constitutional:      General: She is not in acute distress.    Appearance: Normal appearance.  HENT:     Head: Normocephalic and atraumatic.  Eyes:     General:        Right eye: No discharge.        Left eye: No discharge.  Cardiovascular:     Rate and Rhythm: Normal rate and regular rhythm.  Pulmonary:     Effort: Pulmonary effort is normal. No respiratory  distress.  Abdominal:     Comments: No ttp of the abdomen, no rebound, rigidity, guarding  Musculoskeletal:        General: No deformity.  Skin:    General: Skin is warm and dry.  Neurological:     Mental Status: She is alert and oriented to person, place, and time.  Psychiatric:        Mood and Affect: Mood normal.        Behavior: Behavior normal.     (all labs ordered are listed, but only abnormal results are displayed) Labs Reviewed  URINALYSIS, ROUTINE W REFLEX MICROSCOPIC - Abnormal; Notable for the following components:      Result Value   APPearance HAZY (*)    Leukocytes,Ua TRACE (*)    All other components within normal limits  PREGNANCY, URINE  HCG, SERUM, QUALITATIVE    EKG: None  Radiology: No results found.   Procedures   Medications Ordered in the ED - No data to display                                  Medical Decision Making Amount and/or Complexity of Data Reviewed  Labs: ordered.   This is an overall well-appearing 23 year old female who presents concern for late period, some mild abdominal cramping, and some mild back pain.  Mostly patient concerned about being pregnant.  She reports 2 negative pregnancy test at home .  Urine pregnancy test is negative, UA is unremarkable.  hCG Qual shows negative.  Benign abdominal exam, moves all 4 limbs spontaneously, her back pain is reproducible on exam, low clinical suspicion for acute worrisome back injury.  Normal strength throughout.  Patient reassured, encouraged to continue taking prenatal vitamin if she is trying to get pregnant, and to continue monitoring her menstrual cycle.  Final diagnoses:  Negative pregnancy test    ED Discharge Orders     None          Rosan Sherlean VEAR DEVONNA 11/22/23 1117    Rogelia Jerilynn RAMAN, MD 11/26/23 [phone]

## 2023-11-22 NOTE — ED Triage Notes (Signed)
 Patient to ED by POV with c/o lower back pain, ABD discomfort and possible pregnancy. LMP was 9/24 and states she is 7 days late. No N/V/F voiced. Has increased urination and mild diarrhea.

## 2023-11-23 NOTE — Progress Notes (Unsigned)
    SUBJECTIVE:   CHIEF COMPLAINT / HPI:   Pregnancy Test - LMP: - Last sexual encounter: - Symptoms:  PERTINENT  PMH / PSH: ***  OBJECTIVE:   LMP 10/28/2023   General: Awake and Alert in NAD HEENT: NCAT. Sclera anicteric. No rhinorrhea. Cardiovascular: RRR. No M/R/G Respiratory: CTAB, normal WOB on RA. No wheezing, crackles, rhonchi, or diminished breath sounds. Abdomen: Soft, non-tender, non-distended. Bowel sounds normoactive/hypoactive/hyperactive. *** Extremities: Able to move all extremities. No BLE edema, no deformities or significant joint findings. Skin: Warm and dry. No abrasions or rashes noted. Neuro: A&Ox***. No focal neurological deficits.  ASSESSMENT/PLAN:   Assessment & Plan      Kathrine Melena, DO [facility] Medicine Center

## 2023-11-24 ENCOUNTER — Ambulatory Visit (INDEPENDENT_AMBULATORY_CARE_PROVIDER_SITE_OTHER): Admitting: Family Medicine

## 2023-11-24 ENCOUNTER — Encounter: Payer: Self-pay | Admitting: Family Medicine

## 2023-11-24 VITALS — BP 133/68 | HR 70 | Temp 98.7°F | Ht 63.0 in | Wt 231.6 lb

## 2023-11-24 DIAGNOSIS — N926 Irregular menstruation, unspecified: Secondary | ICD-10-CM | POA: Diagnosis present

## 2023-11-24 LAB — POCT URINE PREGNANCY: Preg Test, Ur: NEGATIVE

## 2023-11-24 NOTE — Patient Instructions (Signed)
 It was great to see you today! Thank you for choosing [facility] for your primary care. Anita Hayes was seen for pregnancy test.  Today we addressed: Your pregnancy test was negative today.  This is similar to the pregnancy test that you had taken at home and the pregnancy test that was done on the ED. Continue using contraception regularly.  Use condoms for protection STIs Menstrual cycles can fluctuate month-to-month depending on external stressors.  You should return to our clinic No follow-ups on file. Please arrive 15 minutes before your appointment to ensure smooth check in process.  We appreciate your efforts in making this happen.  Thank you for allowing me to participate in your care, Kathrine Melena, DO 11/24/2023, 2:44 PM PGY-2, [facility]

## 2023-11-30 ENCOUNTER — Telehealth: Payer: Self-pay

## 2023-11-30 NOTE — Telephone Encounter (Signed)
 Patient calls nurse line requesting that I send message to Dr. Janna.   Patient reports that she was experiencing pressure and cramping along with vaginal bleeding on 10/21. She reports that she passed a large blood clot (she believes was pregnancy tissue, as this was not a normal blood clot).   She believes that she was having a miscarriage. She reports that bleeding continued until 10/26. She denies any current lightheadedness, dizziness, abdominal pain. She also reports that all pregnancy tests were negative.   Advised that I would send message to Dr. Janna. Return precautions discussed.   Chiquita JAYSON English, RN

## 2024-02-01 ENCOUNTER — Ambulatory Visit (INDEPENDENT_AMBULATORY_CARE_PROVIDER_SITE_OTHER): Admitting: Family Medicine

## 2024-02-01 ENCOUNTER — Encounter: Payer: Self-pay | Admitting: Family Medicine

## 2024-02-01 ENCOUNTER — Other Ambulatory Visit: Payer: Self-pay | Admitting: Family Medicine

## 2024-02-01 VITALS — BP 118/72 | HR 90 | Temp 98.8°F | Wt 240.6 lb

## 2024-02-01 DIAGNOSIS — Z3201 Encounter for pregnancy test, result positive: Secondary | ICD-10-CM

## 2024-02-01 DIAGNOSIS — Z3200 Encounter for pregnancy test, result unknown: Secondary | ICD-10-CM

## 2024-02-01 LAB — POCT URINE PREGNANCY: Preg Test, Ur: POSITIVE — AB

## 2024-02-01 MED ORDER — PRENATAL VITAMIN 27-0.8 MG PO TABS: 1.0000 | ORAL_TABLET | Freq: Every day | ORAL | 3 refills | Status: DC

## 2024-02-01 NOTE — Patient Instructions (Addendum)
 Thank you for visiting clinic today and allowing us  to participate in your care!  Congrats on your pregnancy! We have placed a referral for you to see the OBGYN specialists for your pregnancy care. We will collect routine labwork today to [place] your care. Please take a multivitamin once a day every day.   Please call [phone] to schedule your OBGYN appointments.   Prenatal Classes Go to [email or web address] for more information on the pregnancy and child birth classes that [facility] has to offer.   Vaccinations If you are with the [employer] Mom Program and need vaccines, these are done the [facility] on [address] [place]. The number to call to schedule an appt is [phone]  Pregnancy Related Return Precautions The follow are signs/symptoms that are abnormal in pregnancy and may require further evaluation by a physician: Go to the MAU at [facility] if: You have cramping/contractions that do not go away with drinking water, especially if they are lasting 30 seconds to 1.5 minutes, coming and going every 5-10 minutes for an hour or more, or are getting stronger and you cannot walk or talk while having a contraction/cramp. Your water breaks.  Sometimes it is a big gush of fluid, sometimes it is just a trickle that keeps getting your underwear wet or running down your legs You have vaginal bleeding.    You do not feel your baby moving like normal.  If you do not, get something to eat and drink (something cold or something with sugar like peanut butter or juice) and lay down and focus on feeling your baby move. If your baby is still not moving like normal, you should go to MAU. You should feel your baby move 6 times in one hour, or 10 times in two hours. You have a persistent headache that does not go away with 1 g of Tylenol , vision changes, chest pain, difficulty breathing, severe pain in your right  upper abdomen, worsening leg swelling- these can all be signs of high blood pressure in pregnancy and need to be evaluated by a provider immediately  These are all concerning in pregnancy and if you have any of these I recommend you call your PCP and present to the Maternity Admissions Unit (map below) for further evaluation.  For any pregnancy-related emergencies, please go to the Maternity Admissions Unit in the [facility] at [facility]. You will use hospital Entrance C.    Our clinic number is [phone].   Dr Donzetta   Reach out any time with any questions or concerns you may have - we are here for you!  Damien Cassis, MD [facility] [phone]

## 2024-02-01 NOTE — Progress Notes (Addendum)
" ° ° °  SUBJECTIVE:   CHIEF COMPLAINT / HPI:   Pregancy concern: -Took 3 home pregnancy tests which have been positive  -LMP: 12/24/23 -No recent contraception use  -Symptoms: nausea, backache -No vaginal bleeding  -Not taking any meds currently  -Desires pregnancy  -2 previous pregnancies - term vaginal deliveries -Hx pre-eclampsia  PERTINENT  PMH / PSH: Congenital heart disease, pulmonary artery stenosis, Hx pre-eclampsia  OBJECTIVE:   BP 118/72   Pulse 90   Temp 98.8 F (37.1 C) (Oral)   Wt 240 lb 9.6 oz (109.1 kg)   LMP 12/24/2023 (Exact Date)   BMI 42.62 kg/m   General: Well-appearing. Resting comfortably in room. CV: S1/S2 with known systolic murmur as previously documented. Warm and well-perfused. Pulm: Breathing comfortably on room air. CTAB. No increased WOB. Abd: Soft, non-tender, non-distended. Skin:  Warm, dry. Psych: Pleasant and appropriate.   ASSESSMENT/PLAN:   Assessment & Plan Encounter for pregnancy test, result unknown Positive pregnancy test Confirmed pregnancy with clinic Upreg test today. Desired pregnancy. Patient desires for OB care with OB/GYN team - this would be helpful with her history of congential heart disease and history of pre-eclampsia. OB/GYN referral placed. Discussed and sent for daily PNV. Initial labwork collected today. Emergent precautions discussed.    RTC as needed.   Damien Cassis, MD [facility]  "

## 2024-02-01 NOTE — Assessment & Plan Note (Signed)
 Confirmed pregnancy with clinic Upreg test today. Desired pregnancy. Patient desires for OB care with OB/GYN team - this would be helpful with her history of congential heart disease and history of pre-eclampsia. OB/GYN referral placed. Discussed and sent for daily PNV. Initial labwork collected today. Emergent precautions discussed.

## 2024-02-02 ENCOUNTER — Encounter: Payer: Self-pay | Admitting: Family Medicine

## 2024-02-03 ENCOUNTER — Ambulatory Visit: Payer: Self-pay | Admitting: Family Medicine

## 2024-02-03 LAB — CBC/D/PLT+RPR+RH+ABO+RUBIGG...
Antibody Screen: NEGATIVE
Basophils Absolute: 0 x10E3/uL (ref 0.0–0.2)
Basos: 0 %
Bilirubin, UA: NEGATIVE
EOS (ABSOLUTE): 0.1 x10E3/uL (ref 0.0–0.4)
Eos: 1 %
Glucose, UA: NEGATIVE
HCV Ab: NONREACTIVE
HIV Screen 4th Generation wRfx: NONREACTIVE
Hematocrit: 38.4 % (ref 34.0–46.6)
Hemoglobin: 11.9 g/dL (ref 11.1–15.9)
Hepatitis B Surface Ag: NEGATIVE
Immature Grans (Abs): 0 x10E3/uL (ref 0.0–0.1)
Immature Granulocytes: 0 %
Ketones, UA: NEGATIVE
Lymphocytes Absolute: 2.2 x10E3/uL (ref 0.7–3.1)
Lymphs: 41 %
MCH: 22.5 pg — ABNORMAL LOW (ref 26.6–33.0)
MCHC: 31 g/dL — ABNORMAL LOW (ref 31.5–35.7)
MCV: 73 fL — ABNORMAL LOW (ref 79–97)
Monocytes Absolute: 0.3 x10E3/uL (ref 0.1–0.9)
Monocytes: 6 %
Neutrophils Absolute: 2.9 x10E3/uL (ref 1.4–7.0)
Neutrophils: 52 %
Nitrite, UA: NEGATIVE
Platelets: 209 x10E3/uL (ref 150–450)
Protein,UA: NEGATIVE
RBC, UA: NEGATIVE
RBC: 5.29 x10E6/uL — ABNORMAL HIGH (ref 3.77–5.28)
RDW: 17.4 % — ABNORMAL HIGH (ref 11.7–15.4)
RPR Ser Ql: NONREACTIVE
Rh Factor: POSITIVE
Rubella Antibodies, IGG: 3.18 {index}
Specific Gravity, UA: 1.018 (ref 1.005–1.030)
Urobilinogen, Ur: 0.2 mg/dL (ref 0.2–1.0)
WBC: 5.5 x10E3/uL (ref 3.4–10.8)
pH, UA: 6 (ref 5.0–7.5)

## 2024-02-03 LAB — MICROSCOPIC EXAMINATION
Casts: NONE SEEN /LPF
Epithelial Cells (non renal): >10 /HPF — AB (ref 0–10)
RBC, Urine: NONE SEEN /HPF (ref 0–2)

## 2024-02-03 LAB — URINE CULTURE, OB REFLEX

## 2024-02-03 LAB — HCV INTERPRETATION

## 2024-02-09 ENCOUNTER — Encounter: Payer: Self-pay | Admitting: Family Medicine

## 2024-03-09 ENCOUNTER — Telehealth: Payer: Self-pay

## 2024-03-09 DIAGNOSIS — O0990 Supervision of high risk pregnancy, unspecified, unspecified trimester: Secondary | ICD-10-CM | POA: Insufficient documentation

## 2024-03-09 DIAGNOSIS — O0991 Supervision of high risk pregnancy, unspecified, first trimester: Secondary | ICD-10-CM

## 2024-03-09 MED ORDER — BLOOD PRESSURE KIT DEVI: 1.0000 | 0 refills | Status: AC | PRN

## 2024-03-09 NOTE — Progress Notes (Cosign Needed)
 New OB Intake  I connected with Anita Hayes  on 03/09/24 at  8:15 AM EST by MyChart Video Visit and verified that I am speaking with the correct person using two identifiers. Nurse is located at [facility] and pt is located at home.  I discussed the limitations, risks, security and privacy concerns of performing an evaluation and management service by telephone and the availability of in person appointments. I also discussed with the patient that there may be a patient responsible charge related to this service. The patient expressed understanding and agreed to proceed.  I explained I am completing New OB Intake today. We discussed EDD of 09/29/2025 based on LMP of 12/24/2023. Pt is G3P2002. I reviewed her allergies, medications and Medical/Surgical/OB history.    Patient Active Problem List   Diagnosis Date Noted   Depressed mood with postpartum onset 08/22/2021   Encounter for initial prescription of transdermal patch hormonal contraceptive device 08/22/2021   Fetal growth restriction antepartum 05/30/2021   Group B Streptococcus carrier, +RV culture, currently pregnant 05/27/2021   Need for follow-up by social worker 05/03/2021   Encounter for screening involving social determinants of health (SDoH) 05/03/2021   BMI 40.0-44.9, adult (HCC) 03/11/2021   IUGR (intrauterine growth restriction) affecting care of mother 03/01/2021   Congenital heart disease in adult 02/18/2021   Alpha thalassemia trait 01/30/2021   History of gestational hypertension 01/15/2021   Maternal morbid obesity, antepartum (HCC) 04/12/2020   Late prenatal care 11/30/2019   Sickle cell trait 06/10/2019   Rubella non-immune status, antepartum 06/08/2019   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 Discussed the nature of our practice with multiple providers including residents and students as well as female and female providers. Due to the size of  the practice, the delivering provider may not be the same as those providing prenatal care.   Patient is not interested in water birth.  MyChart/Babyscripts MyChart access verified. I explained pt will have some visits in office and some virtually. Babyscripts instructions given and order placed. Patient verifies receipt of registration text/e-mail. Account successfully created and app downloaded. If patient is a candidate for Optimized scheduling, add to sticky note.   Blood Pressure Cuff/Weight Scale Blood pressure cuff ordered for patient to pick-up from [employer]. Explained after first prenatal appt pt will check weekly and document in Babyscripts.   Anatomy US  Explained first scheduled US  will be around 19 weeks. Anatomy US  will need to be scheduled at new ob appointment.  Is patient a CenteringPregnancy candidate?  Accepted   Is patient a Mom+Baby Combined Care candidate?  Not a candidate   If accepted, confirm patient does not intend to move from the area for at least 12 months, then notify Mom+Baby staff  Is patient a candidate for Babyscripts Optimization? No, due to hx   First visit review I reviewed new OB appt with patient. Explained pt will be seen by Dr. Eldonna at first visit. Discussed [place] genetic screening with patient. wants Panorama .SABRA Routine prenatal labs is needed at new ob appointment.  Last Pap Diagnosis  Date Value Ref Range Status  02/07/2022 (A)  Final   - Atypical squamous cells of undetermined significance (ASC-US )    Anita Hayes, CMA 03/09/2024  8:21 AM

## 2024-03-09 NOTE — Patient Instructions (Signed)
 Safe Medications in Pregnancy   Acne:  Benzoyl Peroxide  Salicylic Acid   Backache/Headache:  Tylenol: 2 regular strength every 4 hours OR               2 Extra strength every 6 hours   Colds/Coughs/Allergies:  Benadryl (alcohol free) 25 mg every 6 hours as needed  Breath right strips  Claritin  Cepacol throat lozenges  Chloraseptic throat spray  Cold-Eeze- up to three times per day  Cough drops, alcohol free  Flonase (by prescription only)  Guaifenesin  Mucinex  Robitussin DM (plain only, alcohol free)  Saline nasal spray/drops  Sudafed (pseudoephedrine) & Actifed * use only after [redacted] weeks gestation and if you do not have high blood pressure  Tylenol  Vicks Vaporub  Zinc lozenges  Zyrtec   Constipation:  Colace  Ducolax suppositories  Fleet enema  Glycerin suppositories  Metamucil  Milk of magnesia  Miralax  Senokot  Smooth move tea   Diarrhea:  Kaopectate  Imodium A-D   *NO pepto Bismol   Hemorrhoids:  Anusol  Anusol HC  Preparation H  Tucks   Indigestion:  Tums  Maalox  Mylanta  Zantac  Pepcid   Insomnia:  Benadryl (alcohol free) 25mg  every 6 hours as needed  Tylenol PM  Unisom, no Gelcaps   Leg Cramps:  Tums  MagGel   Nausea/Vomiting:  Bonine  Dramamine  Emetrol  Ginger extract  Sea bands  Meclizine  Nausea medication to take during pregnancy:  Unisom (doxylamine succinate 25 mg tablets) Take one tablet daily at bedtime. If symptoms are not adequately controlled, the dose can be increased to a maximum recommended dose of two tablets daily (1/2 tablet in the morning, 1/2 tablet mid-afternoon and one at bedtime).  Vitamin B6 100mg  tablets. Take one tablet twice a day (up to 200 mg per day).   Skin Rashes:  Aveeno products  Benadryl cream or 25mg  every 6 hours as needed  Calamine Lotion  1% cortisone cream   Yeast infection:  Gyne-lotrimin 7  Monistat 7    **If taking multiple medications, please check labels to avoid  duplicating the same active ingredients  **take medication as directed on the label  ** Do not exceed 4000 mg of tylenol in 24 hours  **Do not take medications that contain aspirin or ibuprofen              CenteringPregnancy is a model of prenatal care that started 30 years ago and is used in about 600 practices around the [place]. You meet with a group of 8-12 women due around the same time as you. In Centering you will have individual time with the provider and meet as a group. There's much more time for discussion and learning. You will actually have much more time with your provider in Centering than in traditional prenatal care.? You will come directly into the Centering room and will not wait in the lobby so there is no wasted time. You will have 2-hour visits every 4 weeks then every 2 weeks. You will know your Centering prenatal appointments in advance. In your last month of pregnancy, you may also come in for some individual visits. Additional appointments can be scheduled if you need more care. Studies have shown that CenteringPregnancy improves birth outcomes. We have seen especially big improvements in fewer Black women delivering babies who are too small or born too early. Visit the website CenteringHealthcare for more information. Let your provider or clinic staff know if  you want to sign up or email [email or web address] for more information.   CenteringPregnancy Video

## 2024-03-14 ENCOUNTER — Ambulatory Visit (HOSPITAL_COMMUNITY)

## 2024-03-16 ENCOUNTER — Encounter: Payer: Self-pay | Admitting: Family Medicine
